# Patient Record
Sex: Female | Born: 1970 | Race: Black or African American | Hispanic: No | Marital: Married | Smoking: Never smoker
Health system: Southern US, Community
[De-identification: ages and names within clinical notes are randomized; demographics above are authoritative.]

## PROBLEM LIST (undated history)

## (undated) DIAGNOSIS — E039 Hypothyroidism, unspecified: Secondary | ICD-10-CM

## (undated) DIAGNOSIS — E079 Disorder of thyroid, unspecified: Secondary | ICD-10-CM

## (undated) DIAGNOSIS — E669 Obesity, unspecified: Secondary | ICD-10-CM

## (undated) HISTORY — PX: SHOULDER SURGERY: SHX246

## (undated) HISTORY — DX: Disorder of thyroid, unspecified: E07.9

## (undated) HISTORY — DX: Obesity, unspecified: E66.9

---

## 1998-01-02 HISTORY — PX: SHOULDER SURGERY: SHX246

## 2005-10-27 ENCOUNTER — Emergency Department (HOSPITAL_COMMUNITY): Admission: EM | Admit: 2005-10-27 | Discharge: 2005-10-28 | Payer: Self-pay | Admitting: Emergency Medicine

## 2005-10-27 ENCOUNTER — Emergency Department (HOSPITAL_COMMUNITY): Admission: EM | Admit: 2005-10-27 | Discharge: 2005-10-27 | Payer: Self-pay | Admitting: Emergency Medicine

## 2005-11-10 ENCOUNTER — Emergency Department: Payer: Self-pay | Admitting: Emergency Medicine

## 2006-02-14 ENCOUNTER — Emergency Department (HOSPITAL_COMMUNITY): Admission: EM | Admit: 2006-02-14 | Discharge: 2006-02-14 | Payer: Self-pay | Admitting: Emergency Medicine

## 2006-05-16 ENCOUNTER — Inpatient Hospital Stay (HOSPITAL_COMMUNITY): Admission: AD | Admit: 2006-05-16 | Discharge: 2006-05-17 | Payer: Self-pay | Admitting: Obstetrics & Gynecology

## 2006-05-31 ENCOUNTER — Ambulatory Visit (HOSPITAL_COMMUNITY): Admission: RE | Admit: 2006-05-31 | Discharge: 2006-05-31 | Payer: Self-pay | Admitting: Obstetrics and Gynecology

## 2006-07-17 ENCOUNTER — Emergency Department (HOSPITAL_COMMUNITY): Admission: EM | Admit: 2006-07-17 | Discharge: 2006-07-17 | Payer: Self-pay | Admitting: Emergency Medicine

## 2006-10-19 ENCOUNTER — Inpatient Hospital Stay (HOSPITAL_COMMUNITY): Admission: AD | Admit: 2006-10-19 | Discharge: 2006-10-19 | Payer: Self-pay | Admitting: Obstetrics and Gynecology

## 2006-11-29 ENCOUNTER — Inpatient Hospital Stay (HOSPITAL_COMMUNITY): Admission: AD | Admit: 2006-11-29 | Discharge: 2006-11-29 | Payer: Self-pay | Admitting: Obstetrics and Gynecology

## 2006-12-25 ENCOUNTER — Encounter (INDEPENDENT_AMBULATORY_CARE_PROVIDER_SITE_OTHER): Payer: Self-pay | Admitting: Obstetrics and Gynecology

## 2006-12-25 ENCOUNTER — Inpatient Hospital Stay (HOSPITAL_COMMUNITY): Admission: AD | Admit: 2006-12-25 | Discharge: 2006-12-27 | Payer: Self-pay | Admitting: Obstetrics and Gynecology

## 2007-10-15 ENCOUNTER — Encounter: Admission: RE | Admit: 2007-10-15 | Discharge: 2007-12-05 | Payer: Self-pay | Admitting: Internal Medicine

## 2008-02-06 ENCOUNTER — Inpatient Hospital Stay (HOSPITAL_COMMUNITY): Admission: AD | Admit: 2008-02-06 | Discharge: 2008-02-06 | Payer: Self-pay | Admitting: Obstetrics and Gynecology

## 2008-08-18 ENCOUNTER — Emergency Department (HOSPITAL_COMMUNITY): Admission: EM | Admit: 2008-08-18 | Discharge: 2008-08-18 | Payer: Self-pay | Admitting: Emergency Medicine

## 2010-01-24 ENCOUNTER — Encounter: Payer: Self-pay | Admitting: Obstetrics and Gynecology

## 2010-04-19 LAB — CBC
HCT: 35.9 % — ABNORMAL LOW (ref 36.0–46.0)
Hemoglobin: 12.1 g/dL (ref 12.0–15.0)
MCHC: 33.7 g/dL (ref 30.0–36.0)
MCV: 87.1 fL (ref 78.0–100.0)
RBC: 4.12 MIL/uL (ref 3.87–5.11)
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
## 2010-10-07 LAB — CBC
HCT: 34 — ABNORMAL LOW
MCHC: 34.8
MCV: 88.7
Platelets: 243
RBC: 4.36
RDW: 14.1

## 2010-10-07 LAB — RPR: RPR Ser Ql: NONREACTIVE

## 2010-10-12 LAB — URINALYSIS, ROUTINE W REFLEX MICROSCOPIC
Bilirubin Urine: NEGATIVE
Ketones, ur: NEGATIVE
Nitrite: POSITIVE — AB
Specific Gravity, Urine: 1.025
Urobilinogen, UA: 0.2

## 2011-03-02 ENCOUNTER — Emergency Department (HOSPITAL_COMMUNITY)
Admission: EM | Admit: 2011-03-02 | Discharge: 2011-03-02 | Disposition: A | Discharge status: Home / Self Care | Payer: Self-pay | Attending: Emergency Medicine | Admitting: Emergency Medicine

## 2011-03-02 ENCOUNTER — Emergency Department (HOSPITAL_COMMUNITY): Payer: Self-pay

## 2011-03-02 ENCOUNTER — Encounter (HOSPITAL_COMMUNITY): Payer: Self-pay | Admitting: *Deleted

## 2011-03-02 DIAGNOSIS — W010XXA Fall on same level from slipping, tripping and stumbling without subsequent striking against object, initial encounter: Secondary | ICD-10-CM | POA: Insufficient documentation

## 2011-03-02 DIAGNOSIS — M25569 Pain in unspecified knee: Secondary | ICD-10-CM | POA: Insufficient documentation

## 2011-03-02 DIAGNOSIS — Y9289 Other specified places as the place of occurrence of the external cause: Secondary | ICD-10-CM | POA: Insufficient documentation

## 2011-03-02 DIAGNOSIS — M25561 Pain in right knee: Secondary | ICD-10-CM

## 2011-03-02 DIAGNOSIS — Y990 Civilian activity done for income or pay: Secondary | ICD-10-CM | POA: Insufficient documentation

## 2011-03-02 NOTE — ED Notes (Signed)
Patient transported to X-ray 

## 2011-03-02 NOTE — ED Notes (Signed)
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

## 2011-03-02 NOTE — ED Provider Notes (Signed)
History     CSN: [id number]  Arrival date & time 03/02/11  1623   First MD Initiated Contact with Patient 03/02/11 1804      Chief Complaint  Patient presents with  . Knee Injury    (Consider location/radiation/quality/duration/timing/severity/associated sxs/prior treatment) HPI Comments: Patient reports that while at work she tripped and fell and landed on her right knee.    Patient is a 41 y.o. female presenting with knee pain. The history is provided by the patient.  Knee Pain This is a new problem. The current episode started today. The problem occurs constantly. The problem has been unchanged. Pertinent negatives include no chills, diaphoresis, fever, joint swelling, numbness or weakness. The symptoms are aggravated by bending and walking. She has tried nothing for the symptoms.    History reviewed. No pertinent past medical history.  Past Surgical History  Procedure Date  . Shoulder surgery 2000    left    No family history on file.  History  Substance Use Topics  . Smoking status: Never Smoker   . Smokeless tobacco: Not on file  . Alcohol Use: No    OB History    Grav Para Term Preterm Abortions TAB SAB Ect Mult Living                  Review of Systems  Constitutional: Negative for fever, chills and diaphoresis.  Cardiovascular: Negative for leg swelling.  Musculoskeletal: Negative for joint swelling.  Neurological: Negative for weakness and numbness.    Allergies  Cortisone  Home Medications   Current Outpatient Rx  Name Route Sig Dispense Refill  . IBUPROFEN 200 MG PO TABS Oral Take 600 mg by mouth every 8 (eight) hours as needed. For pain.      BP 122/76  Pulse 71  Temp(Src) 98.5 F (36.9 C) (Oral)  Resp 18  Wt 287 lb (130.182 kg)  SpO2 100%  LMP 01/29/2011  Physical Exam  Nursing note and vitals reviewed. Constitutional: She is oriented to person, place, and time. She appears well-developed and well-nourished. No distress.   Morbidly obese  Neck: Normal range of motion. Neck supple.  Cardiovascular: Normal rate, regular rhythm and normal heart sounds.   Pulmonary/Chest: Effort normal and breath sounds normal.  Musculoskeletal:       Right knee: She exhibits bony tenderness. She exhibits no swelling, no effusion, no ecchymosis, no deformity and no erythema. tenderness found.       Dorsal pedis pulse 2+ bilaterally. Distal sensation intact. Pain with ROM of right knee.  Neurological: She is alert and oriented to person, place, and time.  Skin: Skin is warm and dry. She is not diaphoretic. No erythema.  Psychiatric: She has a normal mood and affect.    ED Course  Procedures (including critical care time)  Labs Reviewed - No data to display Dg Knee Complete 4 Views Right  03/02/2011  *RADIOLOGY REPORT*  Clinical Data: Knee injury, right knee pain post fall  RIGHT KNEE - COMPLETE 4+ VIEW  Comparison: None  Findings: Joint spaces preserved. Osseous mineralization normal. No acute fracture, dislocation or bone destruction. No knee joint effusion.  IMPRESSION: No acute bony abnormalities.  Original Report Authenticated By: Lollie Marrow, M.D.     No diagnosis found.  Patient requesting knee immobilizer for stability.  MDM  Negative xrays.  Neurovascularly intact.  Patient given knee immobilizer and able to ambulate with this.  Patient given referral to ortho if pain does not improve.  Pascal Lux Lilly, PA-C 03/03/11 [phone]

## 2011-03-02 NOTE — ED Notes (Signed)
Pt returned from xray

## 2011-03-02 NOTE — Discharge Instructions (Signed)
Be sure to read and understand instructions below prior to leaving the hospital. If your symptoms persist without any improvement in 1 week it is recommended that you follow up with orthopedics listed above. Take ibuprofen for your pain. Knee Effusion  The medical term for having fluid in your knee is effusion.This means something is wrong inside the knee. Some of the causes of fluid in the knee may be torn cartilage, a torn ligament, or bleeding into the joint from an injury. Small tears may heal on their own with conservative treatment. Conservative means rest, limited weight bearing activity and muscle strengthening exercises. Your recovery may take up to 6 weeks. Larger tears may require surgery.   TREATMENT  Rest, ice, elevation, and compression are the basic modes of treatment.   Apply ice to the sore area for 15 to 20 minutes, 3 to 4 times per day. Do this while you are awake for the first 2 days, or as directed. This can be stopped when the swelling goes away. Put the ice in a plastic bag and place a towel between the bag of ice and your skin.  Keep your leg elevated when possible to lessen swelling.  If your caregiver recommends crutches, use them as instructed for 1 week. Then, you may walk as tolerated.  Do not drive a vehicle on pain medication. ACTIVITY:            - Weight bearing as tolerated            - Exercises should be limited to pain free range of motion  Knee Immobilization:: This is used to support and protect an injured or painful knee. Knee immobilizers keep your knee from being used while it is healing.  Use powder to control irritation from sweat and friction.  Adjust the immobilizer to be firm but not tight. Signs of an immobilizer that is too tight include:   Swelling.   Numbness.   Color change in your foot or ankle.   Increased pain.  While resting, raise your leg above the level of your heart. This reduces throbbing and helps healing. Prop it up with pillows.    Remove the immobilizer to bathe and sleep. Wear it other times until you see your doctor again.               SEEK MEDICAL CARE IF:  You have an increase in bruising, swelling, or pain.  Your toes feel cold.  Pain relief is not achieved with medications.  EMERGENCY:: Your toes are numb or blue or you have severe pain.  You notice redness, swelling, warmth or increasing pain in your knee.  An unexplained oral temperature above 102 F (38.9 C) develops.  COLD THERAPY DIRECTIONS:  Ice or gel packs can be used to reduce both pain and swelling. Ice is the most helpful within the first 24 to 48 hours after an injury or flareup from overusing a muscle or joint.  Ice is effective, has very few side effects, and is safe for most people to use.   If you expose your skin to cold temperatures for too long or without the proper protection, you can damage your skin or nerves. Watch for signs of skin damage due to cold.   HOME CARE INSTRUCTIONS  Follow these tips to use ice and cold packs safely.  Place a dry or damp towel between the ice and skin. A damp towel will cool the skin more quickly, so you may need to  shorten the time that the ice is used.  For a more rapid response, add gentle compression to the ice.  Ice for no more than 10 to 20 minutes at a time. The bonier the area you are icing, the less time it will take to get the benefits of ice.  Check your skin after 5 minutes to make sure there are no signs of a poor response to cold or skin damage.  Rest 20 minutes or more in between uses.  Once your skin is numb, you can end your treatment. You can test numbness by very lightly touching your skin. The touch should be so light that you do not see the skin dimple from the pressure of your fingertip. When using ice, most people will feel these normal sensations in this order: cold, burning, aching, and numbness.  Do not use ice on someone who cannot communicate their responses to pain, such as small  children or people with dementia.   HOW TO MAKE AN ICE PACK  To make an ice pack, do one of the following:  Place crushed ice or a bag of frozen vegetables in a sealable plastic bag. Squeeze out the excess air. Place this bag inside another plastic bag. Slide the bag into a pillowcase or place a damp towel between your skin and the bag.  Mix 3 parts water with 1 part rubbing alcohol. Freeze the mixture in a sealable plastic bag. When you remove the mixture from the freezer, it will be slushy. Squeeze out the excess air. Place this bag inside another plastic bag. Slide the bag into a pillowcase or place a damp towel between your s

## 2011-03-04 NOTE — ED Provider Notes (Signed)
Medical screening examination/treatment/procedure(s) were performed by non-physician practitioner and as supervising physician I was immediately available for consultation/collaboration.  Siennah Barrasso, MD 03/04/11 0124 

## 2013-05-01 ENCOUNTER — Encounter (HOSPITAL_COMMUNITY): Payer: Self-pay | Admitting: Emergency Medicine

## 2013-05-01 ENCOUNTER — Emergency Department (HOSPITAL_COMMUNITY)
Admission: EM | Admit: 2013-05-01 | Discharge: 2013-05-01 | Disposition: A | Discharge status: Home / Self Care | Payer: Medicaid Other | Source: Home / Self Care | Attending: Emergency Medicine | Admitting: Emergency Medicine

## 2013-05-01 ENCOUNTER — Emergency Department (INDEPENDENT_AMBULATORY_CARE_PROVIDER_SITE_OTHER): Payer: Medicaid Other

## 2013-05-01 DIAGNOSIS — M545 Low back pain, unspecified: Secondary | ICD-10-CM

## 2013-05-01 LAB — POCT URINALYSIS DIP (DEVICE)
Bilirubin Urine: NEGATIVE
GLUCOSE, UA: NEGATIVE mg/dL
Ketones, ur: NEGATIVE mg/dL
NITRITE: NEGATIVE
PROTEIN: NEGATIVE mg/dL
Specific Gravity, Urine: 1.02 (ref 1.005–1.030)
UROBILINOGEN UA: 0.2 mg/dL (ref 0.0–1.0)
pH: 6.5 (ref 5.0–8.0)

## 2013-05-01 LAB — POCT PREGNANCY, URINE: PREG TEST UR: NEGATIVE

## 2013-05-01 MED ORDER — OXYCODONE-ACETAMINOPHEN 5-325 MG PO TABS: ORAL_TABLET | ORAL | Status: DC

## 2013-05-01 MED ORDER — ACETAMINOPHEN 325 MG PO TABS
650.0000 mg | ORAL_TABLET | Freq: Once | ORAL | Status: AC
  Administered 2013-05-01: 650 mg via ORAL

## 2013-05-01 MED ORDER — DICLOFENAC SODIUM 75 MG PO TBEC: 75.0000 mg | DELAYED_RELEASE_TABLET | Freq: Two times a day (BID) | ORAL | Status: DC

## 2013-05-01 MED ORDER — KETOROLAC TROMETHAMINE 60 MG/2ML IM SOLN
INTRAMUSCULAR | Status: AC
  Filled 2013-05-01: qty 2

## 2013-05-01 MED ORDER — CYCLOBENZAPRINE HCL 5 MG PO TABS: 5.0000 mg | ORAL_TABLET | Freq: Three times a day (TID) | ORAL | Status: DC | PRN

## 2013-05-01 MED ORDER — KETOROLAC TROMETHAMINE 60 MG/2ML IM SOLN: 60.0000 mg | Freq: Once | INTRAMUSCULAR | Status: DC

## 2013-05-01 MED ORDER — ACETAMINOPHEN 325 MG PO TABS
ORAL_TABLET | ORAL | Status: AC
  Filled 2013-05-01: qty 2

## 2013-05-01 NOTE — ED Provider Notes (Signed)
Chief Complaint   Chief Complaint  Patient presents with  . Back Pain    History of Present Illness   Marcine Mataronja Nabozny is a 43 year old female who woke up yesterday morning with pain in her right mid lumbar area. This did not radiate down into the buttock, thigh, leg was worse with moving, driving, and twisting. She denies any numbness or tingling in the leg or the foot, muscle weakness in the leg, foot drop, bladder or bowel dysfunction, urinary symptoms, or saddle anesthesia. She has had no fever, chills, or weight loss. No injury to the back. She has not had back problems before.  Review of Systems   Other than as noted above, the patient denies any of the following symptoms: Systemic:  No fever, chills, or unexplained weight loss. GI:  No abdominal painor incontinence of bowel. GU:  No dysuria, frequency, urgency, or hematuria. No incontinence of urine or urinary retention.  M-S:  No neck pain or arthritis. Neuro:  No paresthesias, headache, saddle anesthesia, muscular weakness, or progressive neurological deficit.  PMFSH   Past medical history, family history, social history, meds, and allergies were reviewed. Specifically, there is no history of cancer, major trauma, osteoporosis, immunosuppression, or HIV infection.   Physical Examination    Vital signs:  BP 131/80  Pulse 85  Temp(Src) 98.4 F (36.9 C) (Oral)  Resp 16  SpO2 100%  LMP 04/07/2013 General:  Alert, oriented, in no distress. Abdomen:  Soft, non-tender.  No organomegaly or mass.  No pulsatile midline abdominal mass or bruit. Back:  There is pain to palpation in the left mid lumbar area. The back has a limited range of motion with 20 of flexion, 10 of lateral bending to the right, 45 of lateral bending to the left with pain on forward bending and bending to the right but not the left. Straight leg raising was negative. Neuro:  Normal muscle strength, sensations and DTRs. Extremities: Pedal pulses were full,  there was no edema. Skin:  Clear, warm and dry.  No rash.  Labs   Results for orders placed during the hospital encounter of 05/01/13  POCT URINALYSIS DIP (DEVICE)      Result Value Ref Range   Glucose, UA NEGATIVE  NEGATIVE mg/dL   Bilirubin Urine NEGATIVE  NEGATIVE   Ketones, ur NEGATIVE  NEGATIVE mg/dL   Specific Gravity, Urine 1.020  1.005 - 1.030   Hgb urine dipstick MODERATE (*) NEGATIVE   pH 6.5  5.0 - 8.0   Protein, ur NEGATIVE  NEGATIVE mg/dL   Urobilinogen, UA 0.2  0.0 - 1.0 mg/dL   Nitrite NEGATIVE  NEGATIVE   Leukocytes, UA TRACE (*) NEGATIVE  POCT PREGNANCY, URINE      Result Value Ref Range   Preg Test, Ur NEGATIVE  NEGATIVE      Urine was cultured.  Radiology   Dg Abd 1 View  05/01/2013   CLINICAL DATA:  Back pain, right-sided pain for 2 days  EXAM: ABDOMEN - 1 VIEW  COMPARISON:  None.  FINDINGS: The bowel gas pattern is normal. Moderate amount of stool in the ascending colon. No radio-opaque calculi or other significant radiographic abnormality are seen.  IMPRESSION: 1. Nonobstructive bowel gas pattern. 2. Moderate amount of stool in the ascending colon.   Electronically Signed   By: Elige KoHetal  Patel   On: 05/01/2013 10:53    [facility]   Given Tylenol 650 mg by mouth.    Assessment   The encounter  diagnosis was Lumbago.  No evidence of cauda equina syndrome.  Plan     1.  Meds:  The following meds were prescribed:   Discharge Medication List as of 05/01/2013 11:08 AM    START taking these medications   Details  cyclobenzaprine (FLEXERIL) 5 MG tablet Take 1 tablet (5 mg total) by mouth 3 (three) times daily as needed for muscle spasms., Starting 05/01/2013, Until Discontinued, Normal    diclofenac (VOLTAREN) 75 MG EC tablet Take 1 tablet (75 mg total) by mouth 2 (two) times daily., Starting 05/01/2013, Until Discontinued, Normal    oxyCODONE-acetaminophen (PERCOCET) 5-325 MG per tablet 1 to 2 tablets every 6 hours as needed for pain.,  Print        2.  Patient Education/Counseling:  The patient was given appropriate handouts, self care instructions, and instructed in symptomatic relief. The patient was encouraged to try to be as active as possible and given some exercises to do followed by moist heat.  3.  Follow up:  The patient was told to follow up here if no better in 3 to 4 days, or sooner if becoming worse in any way, and given some red flag symptoms such as worsening pain or new neurological symptoms which would prompt immediate return.  Follow up with Dr. Beverely LowSteve Norris if no better in 2 weeks.     Reuben Likesavid C Emersyn Wyss, MD 05/01/13 1230

## 2013-05-01 NOTE — Discharge Instructions (Signed)
Do exercises twice daily followed by moist heat for 15 minutes. ° ° ° ° ° °Try to be as active as possible. ° °If no better in 2 weeks, follow up with orthopedist. ° ° °

## 2013-05-01 NOTE — ED Notes (Signed)
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

## 2013-05-02 LAB — URINE CULTURE: Colony Count: >=100000

## 2013-08-03 ENCOUNTER — Encounter (HOSPITAL_COMMUNITY): Payer: Self-pay | Admitting: Emergency Medicine

## 2013-08-03 ENCOUNTER — Emergency Department (INDEPENDENT_AMBULATORY_CARE_PROVIDER_SITE_OTHER): Payer: Medicaid Other

## 2013-08-03 ENCOUNTER — Emergency Department (HOSPITAL_COMMUNITY)
Admission: EM | Admit: 2013-08-03 | Discharge: 2013-08-03 | Disposition: A | Discharge status: Home / Self Care | Payer: Medicaid Other | Source: Home / Self Care | Attending: Family Medicine | Admitting: Family Medicine

## 2013-08-03 DIAGNOSIS — M7651 Patellar tendinitis, right knee: Secondary | ICD-10-CM

## 2013-08-03 DIAGNOSIS — M7650 Patellar tendinitis, unspecified knee: Secondary | ICD-10-CM

## 2013-08-03 MED ORDER — MELOXICAM 7.5 MG PO TABS: 7.5000 mg | ORAL_TABLET | Freq: Two times a day (BID) | ORAL | Status: DC

## 2013-08-03 NOTE — ED Notes (Signed)
Patient c/o knee pain x 1 week. 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 Also feels pain when she stands and puts pressure on her leg. Patient is alert and oriented and in no acute distress.

## 2013-08-03 NOTE — Discharge Instructions (Signed)
Ice and wrap and medicine as needed, see orthopedist if further problems.

## 2013-08-03 NOTE — ED Provider Notes (Signed)
CSN: [id number]     Arrival date & time 08/03/13  1309 History   First MD Initiated Contact with Patient 08/03/13 1315     Chief Complaint  Patient presents with  . Knee Pain   (Consider location/radiation/quality/duration/timing/severity/associated sxs/prior Treatment) Patient is a 43 y.o. female presenting with knee pain. The history is provided by the patient.  Knee Pain Location:  Knee Injury: no   Knee location:  R knee Pain details:    Quality:  Sharp   Radiates to:  Does not radiate   Severity:  Moderate   Onset quality:  Gradual   Duration:  1 week   Progression:  Worsening Chronicity:  New Dislocation: no   Prior injury to area:  No Worsened by:  Bearing weight (worse with stairs.) Associated symptoms: decreased ROM   Associated symptoms: no back pain and no fever   Risk factors: obesity     History reviewed. No pertinent past medical history. Past Surgical History  Procedure Laterality Date  . Shoulder surgery  2000    left   No family history on file. History  Substance Use Topics  . Smoking status: Never Smoker   . Smokeless tobacco: Not on file  . Alcohol Use: No   OB History   Grav Para Term Preterm Abortions TAB SAB Ect Mult Living                 Review of Systems  Constitutional: Negative.  Negative for fever.  Genitourinary: Negative for pelvic pain.  Musculoskeletal: Positive for gait problem. Negative for back pain and joint swelling.  Skin: Negative.     Allergies  Cortisone  Home Medications   Prior to Admission medications   Medication Sig Start Date End Date Taking? Authorizing Provider  cyclobenzaprine (FLEXERIL) 5 MG tablet Take 1 tablet (5 mg total) by mouth 3 (three) times daily as needed for muscle spasms. 05/01/13   Reuben Likesavid C Keller, MD  diclofenac (VOLTAREN) 75 MG EC tablet Take 1 tablet (75 mg total) by mouth 2 (two) times daily. 05/01/13   Reuben Likesavid C Keller, MD  ibuprofen (ADVIL,MOTRIN) 200 MG tablet Take 600 mg by mouth every 8  (eight) hours as needed. For pain.    Historical Provider, MD  meloxicam (MOBIC) 7.5 MG tablet Take 1 tablet (7.5 mg total) by mouth 2 (two) times daily after a meal. 08/03/13   Linna HoffJames D Braelen Sproule, MD  oxyCODONE-acetaminophen (PERCOCET) 5-325 MG per tablet 1 to 2 tablets every 6 hours as needed for pain. 05/01/13   Reuben Likesavid C Keller, MD   BP 126/72  Pulse 75  Temp(Src) 98.6 F (37 C) (Oral)  Resp 18  SpO2 100%  LMP 07/16/2013 Physical Exam  Nursing note and vitals reviewed. Constitutional: She is oriented to person, place, and time. She appears well-developed and well-nourished.  Musculoskeletal: She exhibits tenderness.       Right knee: She exhibits abnormal alignment and abnormal patellar mobility. She exhibits normal range of motion and no swelling. Tenderness found. Patellar tendon tenderness noted. No medial joint line, no lateral joint line, no MCL and no LCL tenderness noted.       Legs: Neurological: She is alert and oriented to person, place, and time.  Skin: Skin is warm and dry.    ED Course  Procedures (including critical care time) Labs Review Labs Reviewed - No data to display  Imaging Review Dg Knee Complete 4 Views Right  08/03/2013   CLINICAL DATA:  Right knee pain  EXAM: RIGHT KNEE - COMPLETE 4+ VIEW  COMPARISON:  None.  FINDINGS: No fracture or dislocation is seen.  The joint spaces are preserved.  No definite suprapatellar knee joint effusion.  IMPRESSION: No fracture or dislocation is seen.   Electronically Signed   By: Charline Bills M.D.   On: 08/03/2013 13:44     MDM   1. Patellar tendonitis of right knee        Linna Hoff, MD 08/03/13 1401

## 2014-01-08 ENCOUNTER — Ambulatory Visit: Payer: Medicaid Other | Attending: Specialist | Admitting: Physical Therapy

## 2014-01-08 DIAGNOSIS — M949 Disorder of cartilage, unspecified: Secondary | ICD-10-CM | POA: Insufficient documentation

## 2014-01-08 DIAGNOSIS — M25561 Pain in right knee: Secondary | ICD-10-CM | POA: Diagnosis present

## 2015-12-31 ENCOUNTER — Emergency Department (HOSPITAL_COMMUNITY): Payer: No Typology Code available for payment source

## 2015-12-31 ENCOUNTER — Encounter (HOSPITAL_COMMUNITY): Payer: Self-pay | Admitting: *Deleted

## 2015-12-31 ENCOUNTER — Emergency Department (HOSPITAL_COMMUNITY)
Admission: EM | Admit: 2015-12-31 | Discharge: 2015-12-31 | Disposition: A | Discharge status: Home / Self Care | Payer: No Typology Code available for payment source | Attending: Emergency Medicine | Admitting: Emergency Medicine

## 2015-12-31 DIAGNOSIS — Y92410 Unspecified street and highway as the place of occurrence of the external cause: Secondary | ICD-10-CM | POA: Insufficient documentation

## 2015-12-31 DIAGNOSIS — Z79899 Other long term (current) drug therapy: Secondary | ICD-10-CM | POA: Insufficient documentation

## 2015-12-31 DIAGNOSIS — S299XXA Unspecified injury of thorax, initial encounter: Secondary | ICD-10-CM | POA: Insufficient documentation

## 2015-12-31 DIAGNOSIS — Y939 Activity, unspecified: Secondary | ICD-10-CM | POA: Diagnosis not present

## 2015-12-31 DIAGNOSIS — Y999 Unspecified external cause status: Secondary | ICD-10-CM | POA: Insufficient documentation

## 2015-12-31 DIAGNOSIS — R0789 Other chest pain: Secondary | ICD-10-CM

## 2015-12-31 MED ORDER — IBUPROFEN 600 MG PO TABS: 600.0000 mg | ORAL_TABLET | Freq: Four times a day (QID) | ORAL | 0 refills | Status: DC | PRN

## 2015-12-31 MED ORDER — IBUPROFEN 200 MG PO TABS
600.0000 mg | ORAL_TABLET | Freq: Once | ORAL | Status: AC
  Administered 2015-12-31: 600 mg via ORAL
  Filled 2015-12-31: qty 1

## 2015-12-31 NOTE — ED Notes (Signed)
Respiratory brought Incentive spirometer
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
## 2015-12-31 NOTE — ED Notes (Signed)
Pt stable, ambulatory, states understanding of discharge instructions 

## 2015-12-31 NOTE — ED Notes (Addendum)
See Provider note for assessment.

## 2015-12-31 NOTE — Discharge Instructions (Signed)
Take tylenol in addition to the medication we give you. Return as needed for worsening symptoms.
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
## 2019-05-30 DIAGNOSIS — Z0001 Encounter for general adult medical examination with abnormal findings: Secondary | ICD-10-CM | POA: Diagnosis not present

## 2019-06-12 DIAGNOSIS — R946 Abnormal results of thyroid function studies: Secondary | ICD-10-CM | POA: Diagnosis not present

## 2019-06-12 DIAGNOSIS — Z13220 Encounter for screening for lipoid disorders: Secondary | ICD-10-CM | POA: Diagnosis not present

## 2019-06-12 DIAGNOSIS — Z131 Encounter for screening for diabetes mellitus: Secondary | ICD-10-CM | POA: Diagnosis not present

## 2019-06-13 DIAGNOSIS — R7989 Other specified abnormal findings of blood chemistry: Secondary | ICD-10-CM | POA: Diagnosis not present

## 2019-07-03 DIAGNOSIS — R946 Abnormal results of thyroid function studies: Secondary | ICD-10-CM | POA: Diagnosis not present

## 2019-07-03 DIAGNOSIS — R7303 Prediabetes: Secondary | ICD-10-CM | POA: Diagnosis not present

## 2019-07-03 DIAGNOSIS — Z6841 Body Mass Index (BMI) 40.0 and over, adult: Secondary | ICD-10-CM | POA: Diagnosis not present

## 2019-07-11 ENCOUNTER — Other Ambulatory Visit: Payer: Self-pay | Admitting: Internal Medicine

## 2019-07-11 DIAGNOSIS — E010 Iodine-deficiency related diffuse (endemic) goiter: Secondary | ICD-10-CM

## 2019-07-11 DIAGNOSIS — E0590 Thyrotoxicosis, unspecified without thyrotoxic crisis or storm: Secondary | ICD-10-CM

## 2019-08-07 DIAGNOSIS — R946 Abnormal results of thyroid function studies: Secondary | ICD-10-CM | POA: Diagnosis not present

## 2020-01-30 DIAGNOSIS — Z111 Encounter for screening for respiratory tuberculosis: Secondary | ICD-10-CM | POA: Diagnosis not present

## 2020-09-05 ENCOUNTER — Emergency Department (HOSPITAL_COMMUNITY)
Admission: EM | Admit: 2020-09-05 | Discharge: 2020-09-05 | Disposition: A | Discharge status: Home / Self Care | Payer: BC Managed Care – PPO | Attending: Emergency Medicine | Admitting: Emergency Medicine

## 2020-09-05 ENCOUNTER — Emergency Department (HOSPITAL_COMMUNITY): Payer: BC Managed Care – PPO

## 2020-09-05 ENCOUNTER — Other Ambulatory Visit: Payer: Self-pay

## 2020-09-05 DIAGNOSIS — M79622 Pain in left upper arm: Secondary | ICD-10-CM | POA: Diagnosis not present

## 2020-09-05 DIAGNOSIS — S0003XA Contusion of scalp, initial encounter: Secondary | ICD-10-CM | POA: Insufficient documentation

## 2020-09-05 DIAGNOSIS — M25512 Pain in left shoulder: Secondary | ICD-10-CM | POA: Insufficient documentation

## 2020-09-05 DIAGNOSIS — R000 Tachycardia, unspecified: Secondary | ICD-10-CM | POA: Insufficient documentation

## 2020-09-05 DIAGNOSIS — M25561 Pain in right knee: Secondary | ICD-10-CM | POA: Insufficient documentation

## 2020-09-05 DIAGNOSIS — Y92410 Unspecified street and highway as the place of occurrence of the external cause: Secondary | ICD-10-CM | POA: Insufficient documentation

## 2020-09-05 DIAGNOSIS — S0990XA Unspecified injury of head, initial encounter: Secondary | ICD-10-CM | POA: Diagnosis present

## 2020-09-05 DIAGNOSIS — M79652 Pain in left thigh: Secondary | ICD-10-CM | POA: Diagnosis not present

## 2020-09-05 MED ORDER — HYDROCODONE-ACETAMINOPHEN 5-325 MG PO TABS
1.0000 | ORAL_TABLET | Freq: Once | ORAL | Status: AC
  Administered 2020-09-05: 1 via ORAL
  Filled 2020-09-05: qty 1
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
## 2020-09-05 NOTE — ED Notes (Signed)
Family at bedside. 
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
## 2020-09-05 NOTE — ED Notes (Signed)
Patient transported to X-ray 

## 2021-07-18 ENCOUNTER — Emergency Department (HOSPITAL_COMMUNITY): Payer: BC Managed Care – PPO

## 2021-07-18 ENCOUNTER — Encounter: Payer: Self-pay | Admitting: Physician Assistant

## 2021-07-18 ENCOUNTER — Encounter (HOSPITAL_COMMUNITY): Payer: Self-pay

## 2021-07-18 ENCOUNTER — Emergency Department (HOSPITAL_COMMUNITY)
Admission: EM | Admit: 2021-07-18 | Discharge: 2021-07-18 | Disposition: A | Discharge status: Home / Self Care | Payer: BC Managed Care – PPO | Attending: Emergency Medicine | Admitting: Emergency Medicine

## 2021-07-18 ENCOUNTER — Other Ambulatory Visit: Payer: Self-pay

## 2021-07-18 ENCOUNTER — Other Ambulatory Visit (HOSPITAL_COMMUNITY): Payer: Self-pay

## 2021-07-18 DIAGNOSIS — R1084 Generalized abdominal pain: Secondary | ICD-10-CM | POA: Insufficient documentation

## 2021-07-18 DIAGNOSIS — R109 Unspecified abdominal pain: Secondary | ICD-10-CM | POA: Diagnosis present

## 2021-07-18 DIAGNOSIS — N9489 Other specified conditions associated with female genital organs and menstrual cycle: Secondary | ICD-10-CM | POA: Insufficient documentation

## 2021-07-18 DIAGNOSIS — K8020 Calculus of gallbladder without cholecystitis without obstruction: Secondary | ICD-10-CM

## 2021-07-18 LAB — URINALYSIS, ROUTINE W REFLEX MICROSCOPIC
Bacteria, UA: NONE SEEN
Bilirubin Urine: NEGATIVE
Glucose, UA: NEGATIVE mg/dL
Ketones, ur: 5 mg/dL — AB
Nitrite: NEGATIVE
Protein, ur: NEGATIVE mg/dL
Specific Gravity, Urine: >1.046 — ABNORMAL HIGH (ref 1.005–1.030)
pH: 7 (ref 5.0–8.0)

## 2021-07-18 LAB — COMPREHENSIVE METABOLIC PANEL
ALT: 13 U/L (ref 0–44)
AST: 16 U/L (ref 15–41)
Albumin: 3.5 g/dL (ref 3.5–5.0)
Alkaline Phosphatase: 51 U/L (ref 38–126)
Anion gap: 7 (ref 5–15)
BUN: 16 mg/dL (ref 6–20)
CO2: 24 mmol/L (ref 22–32)
Calcium: 8.9 mg/dL (ref 8.9–10.3)
Chloride: 111 mmol/L (ref 98–111)
Creatinine, Ser: 0.64 mg/dL (ref 0.44–1.00)
GFR, Estimated: >60 mL/min (ref >60)
Glucose, Bld: 105 mg/dL — ABNORMAL HIGH (ref 70–99)
Potassium: 3.6 mmol/L (ref 3.5–5.1)
Sodium: 142 mmol/L (ref 135–145)
Total Bilirubin: 0.4 mg/dL (ref 0.3–1.2)
Total Protein: 7 g/dL (ref 6.5–8.1)

## 2021-07-18 LAB — I-STAT BETA HCG BLOOD, ED (MC, WL, AP ONLY): I-stat hCG, quantitative: <5 m[IU]/mL (ref <5)

## 2021-07-18 LAB — CBC WITH DIFFERENTIAL/PLATELET
Abs Immature Granulocytes: 0.05 10*3/uL (ref 0.00–0.07)
Basophils Absolute: 0.1 10*3/uL (ref 0.0–0.1)
Basophils Relative: 1 %
Eosinophils Absolute: 0.3 10*3/uL (ref 0.0–0.5)
Eosinophils Relative: 3 %
HCT: 39.4 % (ref 36.0–46.0)
Hemoglobin: 12.6 g/dL (ref 12.0–15.0)
Immature Granulocytes: 1 %
Lymphocytes Relative: 18 %
Lymphs Abs: 1.5 10*3/uL (ref 0.7–4.0)
MCH: 27.6 pg (ref 26.0–34.0)
MCHC: 32 g/dL (ref 30.0–36.0)
MCV: 86.4 fL (ref 80.0–100.0)
Monocytes Absolute: 0.7 10*3/uL (ref 0.1–1.0)
Monocytes Relative: 8 %
Neutro Abs: 5.7 10*3/uL (ref 1.7–7.7)
Neutrophils Relative %: 69 %
Platelets: 314 10*3/uL (ref 150–400)
RBC: 4.56 MIL/uL (ref 3.87–5.11)
RDW: 13.6 % (ref 11.5–15.5)
WBC: 8.2 10*3/uL (ref 4.0–10.5)
nRBC: 0 % (ref 0.0–0.2)

## 2021-07-18 LAB — LIPASE, BLOOD: Lipase: 22 U/L (ref 11–51)

## 2021-07-18 MED ORDER — IOHEXOL 350 MG/ML SOLN
100.0000 mL | Freq: Once | INTRAVENOUS | Status: AC | PRN
  Administered 2021-07-18: 100 mL via INTRAVENOUS

## 2021-07-18 MED ORDER — ONDANSETRON HCL 4 MG/2ML IJ SOLN
4.0000 mg | Freq: Once | INTRAMUSCULAR | Status: AC
  Administered 2021-07-18: 4 mg via INTRAVENOUS
  Filled 2021-07-18: qty 2

## 2021-07-18 MED ORDER — ONDANSETRON 4 MG PO TBDP
4.0000 mg | ORAL_TABLET | Freq: Three times a day (TID) | ORAL | 0 refills | Status: DC | PRN
  Filled 2021-07-18: qty 20, 7d supply, fill #0

## 2021-07-18 MED ORDER — HYDROMORPHONE HCL 1 MG/ML IJ SOLN
1.0000 mg | Freq: Once | INTRAMUSCULAR | Status: AC
  Administered 2021-07-18: 1 mg via INTRAVENOUS
  Filled 2021-07-18: qty 1

## 2021-07-18 NOTE — ED Triage Notes (Signed)
Pt BIB [facility] from home. Pt c/o flank pain that started 1 day ago and progressively got worse up to an hour before arrival. Pt denies kidney stone history. Denies dysuria, urinary retention

## 2021-07-18 NOTE — ED Notes (Signed)
US at bedside

## 2021-07-18 NOTE — ED Provider Notes (Signed)
[facility]-EMERGENCY DEPT Provider Note   CSN: [id number] Arrival date & time: 07/18/21  0446     History  Chief Complaint  Patient presents with   Flank Pain    Amber Koch is a 51 y.o. female.  51 year old female presents to the emergency department for evaluation of back and abdominal pain.  Pain began 1 day ago.  She describes pain across her low back, though it also is present under her right breast.  Pain intensified 1 hour prior to arrival and was unrelieved with a muscle relaxer that patient had leftover from a prior prescription.  She has had associated nausea.  No fevers, hematuria, dysuria, vomiting, melena or hematochezia, bowel/bladder incontinence, genital numbness.  She had a normal bowel movement yesterday morning.  No prior abdominal surgeries.  The history is provided by the patient. No language interpreter was used.  Flank Pain       Home Medications Prior to Admission medications   Medication Sig Start Date End Date Taking? Authorizing Provider  ibuprofen (ADVIL,MOTRIN) 600 MG tablet Take 1 tablet (600 mg total) by mouth every 6 (six) hours as needed. 12/31/15   Janne Napoleon, NP  oxyCODONE-acetaminophen (PERCOCET) 5-325 MG per tablet 1 to 2 tablets every 6 hours as needed for pain. 05/01/13   Reuben Likes, MD      Allergies    Cortisone    Review of Systems   Review of Systems  Genitourinary:  Positive for flank pain.  Ten systems reviewed and are negative for acute change, except as noted in the HPI.    Physical Exam Updated Vital Signs BP 95/70   Pulse 64   Temp 98.1 F (36.7 C) (Oral)   Resp 12   Ht 5' (1.524 m)   Wt 131.5 kg   SpO2 98%   BMI 56.64 kg/m   Physical Exam Vitals and nursing note reviewed.  Constitutional:      General: She is not in acute distress.    Appearance: She is well-developed. She is not diaphoretic.     Comments: Morbidly obese, pleasant AA female.  HENT:     Head: Normocephalic and  atraumatic.  Eyes:     General: No scleral icterus.    Conjunctiva/sclera: Conjunctivae normal.  Cardiovascular:     Rate and Rhythm: Normal rate and regular rhythm.     Pulses: Normal pulses.  Pulmonary:     Effort: Pulmonary effort is normal. No respiratory distress.     Comments: Respirations even and unlabored Abdominal:     Palpations: Abdomen is soft.     Tenderness: There is abdominal tenderness.     Comments: Abdomen obese limiting exam.  Diffuse tenderness noted, though this is appreciated to be worse in the right upper quadrant and right lower quadrant.  No peritoneal signs.  Musculoskeletal:        General: Normal range of motion.     Cervical back: Normal range of motion.  Skin:    General: Skin is warm and dry.     Coloration: Skin is not pale.     Findings: No erythema or rash.  Neurological:     Mental Status: She is alert and oriented to person, place, and time.     Coordination: Coordination normal.  Psychiatric:        Behavior: Behavior normal.     ED Results / Procedures / Treatments   Labs (all labs ordered are listed, but only abnormal results are displayed) Labs  Reviewed  COMPREHENSIVE METABOLIC PANEL - Abnormal; Notable for the following components:      Result Value   Glucose, Bld 105 (*)    All other components within normal limits  CBC WITH DIFFERENTIAL/PLATELET  LIPASE, BLOOD  URINALYSIS, ROUTINE W REFLEX MICROSCOPIC  I-STAT BETA HCG BLOOD, ED ([facility], [facility], [facility] ONLY)    EKG None  Radiology CT ABDOMEN PELVIS W CONTRAST  Result Date: 07/18/2021 CLINICAL DATA:  51 year old female with flank pain x1 day. EXAM: CT ABDOMEN AND PELVIS WITH CONTRAST TECHNIQUE: Multidetector CT imaging of the abdomen and pelvis was performed using the standard protocol following bolus administration of intravenous contrast. RADIATION DOSE REDUCTION: This exam was performed according to the departmental dose-optimization program which includes automated exposure control,  adjustment of the mA and/or kV according to patient size and/or use of iterative reconstruction technique. CONTRAST:  OMNIPAQUE IOHEXOL 350 MG/ML SOLN COMPARISON:  None Available. FINDINGS: Lower chest: Mild lung base atelectasis, otherwise negative. Hepatobiliary: Layering gravel type gallstones. No gallbladder inflammation or biliary ductal enlargement. Liver enhancement remains normal. Pancreas: Negative. Spleen: Negative. Adrenals/Urinary Tract: Normal adrenal glands. Nonobstructed kidneys. No nephrolithiasis or pararenal inflammation. Small left renal upper pole benign-appearing cyst (no follow-up imaging recommended). Both ureters are decompressed. Negative bladder. Stomach/Bowel: Mild retained stool throughout the colon. Normal appendix tracking medial from the cecum series 2, image 56. No large bowel inflammation. Decompressed terminal ileum. No dilated small bowel. Fat containing umbilical hernia with no active inflammation (sagittal image 96). Mostly decompressed stomach. Negative duodenum. No free air, free fluid, mesenteric inflammation. Vascular/Lymphatic: Major arterial structures and the portal venous system in the abdomen and pelvis appear patent. Minimal iliac calcified atherosclerosis. No lymphadenopathy identified. Reproductive: Negative. Other: No pelvic free fluid. Musculoskeletal: Lower lumbar facet arthropathy. Vacuum facet. No acute osseous abnormality identified. IMPRESSION: 1. Cholelithiasis with no CT evidence of acute cholecystitis or bile duct obstruction. But if there is right side pain then Right Upper Quadrant Ultrasound would be complementary. 2. No acute or inflammatory process identified in the abdomen or pelvis. No urinary calculi. Normal appendix. Fat containing umbilical hernia. Electronically Signed   By: Odessa Fleming M.D.   On: 07/18/2021 06:41    Procedures Procedures    Medications Ordered in ED Medications  HYDROmorphone (DILAUDID) injection 1 mg (1 mg Intravenous  Given 07/18/21 0551)  ondansetron (ZOFRAN) injection 4 mg (4 mg Intravenous Given 07/18/21 0548)  iohexol (OMNIPAQUE) 350 MG/ML injection 100 mL (100 mLs Intravenous Contrast Given 07/18/21 5093)    ED Course/ Medical Decision Making/ A&P                           Medical Decision Making Amount and/or Complexity of Data Reviewed Labs: ordered. Radiology: ordered.  Risk Prescription drug management.   This patient presents to the ED for concern of back and flank pain, this involves an extensive number of treatment options, and is a complaint that carries with it a high risk of complications and morbidity.  The differential diagnosis includes pyelonephritis vs kidney stone vs biliary colic vs. MSK sprain/strain   Co morbidities that complicate the patient evaluation  Morbid obesity   Lab Tests:  I Ordered, and personally interpreted labs.  The pertinent results include:  normal CBC, CMP, lipase   Imaging Studies ordered:  I ordered imaging studies including CT abdomen/pelvis  I independently visualized and interpreted imaging which showed gallstones without pericholecystic fluid. I agree with the radiologist interpretation  Cardiac Monitoring:  The patient was maintained on a cardiac monitor.  I personally viewed and interpreted the cardiac monitored which showed an underlying rhythm of: NSR   Medicines ordered and prescription drug management:  I ordered medication including Dilaudid for pain and Zofran for nausea   Reevaluation of the patient after these medicines showed that the patient improved I have reviewed the patients home medicines and have made adjustments as needed   Social Determinants of Health:  Insured.  Good social support; family at bedside.   Dispostion:  Disposition per CT imaging and UA results. Care signed out to Smoot, PA-C at shift change         Final Clinical Impression(s) / ED Diagnoses Final diagnoses:  Abdominal pain,  unspecified abdominal location    Rx / DC Orders ED Discharge Orders     None         Antony Madura, PA-C 07/18/21 6629    Pollyann Savoy, MD 07/18/21 [phone]

## 2021-07-18 NOTE — ED Provider Notes (Signed)
Care handoff from [facility], PA-C at shift change. Please see their note for further information.  Briefly: Patient presents today with complaints of back and abdominal pain that began 1 day ago. Pain present across her low back as well as under the right breast. Nausea without vomiting or diarrhea.  Plan: Labs overall unremarkable, UA pending as well as CT. Will reassess following these results.  CT scan results reveal   1. Cholelithiasis with no CT evidence of acute cholecystitis or bile duct obstruction. But if there is right side pain then Right Upper Quadrant Ultrasound would be complementary.   2. No acute or inflammatory process identified in the abdomen or pelvis. No urinary calculi. Normal appendix. Fat containing umbilical hernia.  RUQ ordered for further evaluation which reveals  Cholelithiasis without gallbladder wall thickening or pericholecystic fluid. No biliary dilatation.  I have personally reviewed and interpreted this imaging and agree with radiology interpretation.  UA reveals small hgb, 5 ketones, and moderate leukocytes without WBCs or bacteria. No concern for UTI  Upon reassessment, patient states that her pain has improved, however requests second round of pain medication for management of symptoms.   Patient is nontoxic, nonseptic appearing, in no apparent distress.  Patient's pain and other symptoms adequately managed in emergency department.  Fluid bolus given.  Labs, imaging and vitals reviewed.  Patient does not meet the SIRS or Sepsis criteria.  On repeat exam patient does not have a surgical abdomin and there are no peritoneal signs.  No indication of appendicitis, bowel obstruction, bowel perforation, cholecystitis, diverticulitis, PID or ectopic pregnancy.  Patient discharged home with Zofran for symptomatic treatment and given strict instructions for follow-up with their primary care physician. Will also give GI follow-up given CT and US findings. I have  also discussed reasons to return immediately to the ER.  Patient expresses understanding and agrees with plan. Discharged in stable condition.     Vear Clock 07/18/21 1226    Linwood Dibbles, MD 07/18/21 [phone]

## 2021-07-18 NOTE — Discharge Instructions (Addendum)
As we discussed, your work-up in the ER today was reassuring for acute abnormalities.  CT, ultrasound, and laboratory evaluation did not show any emergent concerns.  There was some findings of gallstones within your gallbladder, however no emergent concerns related to this.  I have given you a referral to gastroenterology for continued evaluation and management of this.  You will need to call and schedule an appointment at your earliest convenience.  I have also given you a prescription for Zofran to help with any residual nausea that you might have.  Return if development of any new or worsening symptoms.

## 2021-07-29 ENCOUNTER — Other Ambulatory Visit (INDEPENDENT_AMBULATORY_CARE_PROVIDER_SITE_OTHER): Payer: BC Managed Care – PPO

## 2021-07-29 ENCOUNTER — Ambulatory Visit (INDEPENDENT_AMBULATORY_CARE_PROVIDER_SITE_OTHER): Payer: BC Managed Care – PPO | Admitting: Physician Assistant

## 2021-07-29 ENCOUNTER — Encounter: Payer: Self-pay | Admitting: Physician Assistant

## 2021-07-29 VITALS — BP 124/92 | HR 87 | Ht 60.0 in | Wt 287.0 lb

## 2021-07-29 DIAGNOSIS — R1011 Right upper quadrant pain: Secondary | ICD-10-CM | POA: Diagnosis not present

## 2021-07-29 DIAGNOSIS — M5489 Other dorsalgia: Secondary | ICD-10-CM | POA: Diagnosis not present

## 2021-07-29 DIAGNOSIS — K8020 Calculus of gallbladder without cholecystitis without obstruction: Secondary | ICD-10-CM

## 2021-07-29 DIAGNOSIS — K8050 Calculus of bile duct without cholangitis or cholecystitis without obstruction: Secondary | ICD-10-CM | POA: Diagnosis not present

## 2021-07-29 LAB — COMPREHENSIVE METABOLIC PANEL
ALT: 14 U/L (ref 0–35)
AST: 14 U/L (ref 0–37)
Albumin: 4.1 g/dL (ref 3.5–5.2)
Alkaline Phosphatase: 56 U/L (ref 39–117)
BUN: 19 mg/dL (ref 6–23)
CO2: 27 mEq/L (ref 19–32)
Calcium: 8.8 mg/dL (ref 8.4–10.5)
Chloride: 107 mEq/L (ref 96–112)
Creatinine, Ser: 0.58 mg/dL (ref 0.40–1.20)
GFR: 106.27 mL/min (ref >60.00)
Glucose, Bld: 90 mg/dL (ref 70–99)
Potassium: 3.8 mEq/L (ref 3.5–5.1)
Sodium: 140 mEq/L (ref 135–145)
Total Bilirubin: 0.5 mg/dL (ref 0.2–1.2)
Total Protein: 6.9 g/dL (ref 6.0–8.3)

## 2021-07-29 LAB — CBC WITH DIFFERENTIAL/PLATELET
Basophils Absolute: 0.1 10*3/uL (ref 0.0–0.1)
Basophils Relative: 1.2 % (ref 0.0–3.0)
Eosinophils Absolute: 0.2 10*3/uL (ref 0.0–0.7)
Eosinophils Relative: 3.3 % (ref 0.0–5.0)
HCT: 37.7 % (ref 36.0–46.0)
Hemoglobin: 12.6 g/dL (ref 12.0–15.0)
Lymphocytes Relative: 23 % (ref 12.0–46.0)
Lymphs Abs: 1.7 10*3/uL (ref 0.7–4.0)
MCHC: 33.4 g/dL (ref 30.0–36.0)
MCV: 83.6 fl (ref 78.0–100.0)
Monocytes Absolute: 0.6 10*3/uL (ref 0.1–1.0)
Monocytes Relative: 7.8 % (ref 3.0–12.0)
Neutro Abs: 4.8 10*3/uL (ref 1.4–7.7)
Neutrophils Relative %: 64.7 % (ref 43.0–77.0)
Platelets: 274 10*3/uL (ref 150.0–400.0)
RBC: 4.51 Mil/uL (ref 3.87–5.11)
RDW: 14.6 % (ref 11.5–15.5)
WBC: 7.4 10*3/uL (ref 4.0–10.5)

## 2021-07-29 NOTE — Progress Notes (Signed)
Subjective:    Patient ID: Amber Koch, female    DOB: [birth year], 51 y.o.   MRN: [id number]  HPI  Amber Koch is a pleasant 51 year old African-American female, new to GI today referred by the [facility] emergency room after recent ER visit on 07/18/2021 when she was evaluated for acute abdominal pain. She has not had any prior GI evaluation. She says that she was having some milder episodes of pain for a couple of days prior to the ER visit then on the day of of the ER evaluation she was awakened in the middle of the night with severe sided back pain that seem to radiate around into the right upper abdomen she describes this as being sharp deep pain that was constant for multiple hours until she had pain medication in the emergency room.  No associated nausea or vomiting no diarrhea no melena or hematochezia, no fever or chills. After she was discharged in the emergency room she has continued to have the same discomfort which has not been as severe but has not completely resolved.  She still feeling the pain more in the right back and some in the right upper quadrant intermittently.  She does feel that it gets aggravated postprandially, she still has not had any nausea or vomiting, no changes in bowels, no fever. She is trying to eat smaller amounts and healthier foods.  Labs on 07/18/2021 CBC within normal limits, c-Met within normal limits, lipase 22, beta-hCG negative  Upper abdominal ultrasound showed multiple layering gallstones including 1 attentionally 2 cm stone towards the neck of the gallbladder, no gallbladder wall thickening and CBD of 2 mm  CT abdomen and pelvis with contrast shows multiple layering gravel type gallstones, no gallbladder wall thickening, no biliary dilation, liver enhancement normal, small fat-containing umbilical hernia   Review of Systems Pertinent positive and negative review of systems were noted in the above HPI section.  All other review of systems was otherwise  negative.   Outpatient Encounter Medications as of 07/29/2021  Medication Sig   ibuprofen (ADVIL,MOTRIN) 600 MG tablet Take 1 tablet (600 mg total) by mouth every 6 (six) hours as needed.   ondansetron (ZOFRAN-ODT) 4 MG disintegrating tablet Take 1 tablet (4 mg total) by mouth every 8 (eight) hours as needed for nausea or vomiting. (Patient not taking: Reported on 07/29/2021)   oxyCODONE-acetaminophen (PERCOCET) 5-325 MG per tablet 1 to 2 tablets every 6 hours as needed for pain. (Patient not taking: Reported on 07/29/2021)   No facility-administered encounter medications on file as of 07/29/2021.   Allergies  Allergen Reactions   Cortisone    There are no problems to display for this patient.  Social History   Socioeconomic History   Marital status: Married    Spouse name: Not on file   Number of children: 2   Years of education: Not on file   Highest education level: Not on file  Occupational History   Occupation: teacher  Tobacco Use   Smoking status: Never   Smokeless tobacco: Never  Vaping Use   Vaping Use: Never used  Substance and Sexual Activity   Alcohol use: No   Drug use: No   Sexual activity: Not on file  Other Topics Concern   Not on file  Social History Narrative   Not on file   Social Determinants of Health   Financial Resource Strain: Not on file  Food Insecurity: Not on file  Transportation Needs: Not on file  Physical Activity: Not  on file  Stress: Not on file  Social Connections: Not on file  Intimate Partner Violence: Not on file    Amber Koch's family history includes Breast cancer in her maternal aunt; Diabetes in her father, maternal grandfather, maternal grandmother, mother, paternal grandfather, and paternal grandmother; Lung cancer in her mother; Pancreatic cancer in her father.      Objective:    Vitals:   07/29/21 0829  BP: (!) 124/92  Pulse: 87    Physical ExamWell-developed well-nourished obese AA female in no acute distress.   Height, Weight 287, BMI 56.0  HEENT; nontraumatic normocephalic, EOMI, PE R LA, sclera anicteric. Oropharynx;nor examined Neck; supple, no JVD Cardiovascular; regular rate and rhythm with S1-S2, no murmur rub or gallop Pulmonary; Clear bilaterally Abdomen; soft,morbidly obese  minimal  tenderness RUQ  nondistended, no palpable mass or hepatosplenomegaly, bowel sounds are active Rectal;not done Skin; benign exam, no jaundice rash or appreciable lesions Extremities; no clubbing cyanosis or edema skin warm and dry Neuro/Psych; alert and oriented x4, grossly nonfocal mood and affect appropriate        Assessment & Plan:   #18 51 year old African-American female with onset of acute severe right back pain radiating into the right abdomen on 07/18/2021.  No associated nausea vomiting, no fever or chills, no changes in bowel habits  ER evaluation with normal labs Both ultrasound and CT confirm multiple gallstones, ultrasound shows 1 potential 2 cm gallstone located towards the neck of the gallbladder, no evidence of cholecystitis, and no biliary dilation.  Patient has continued to have pain over the past 2 weeks though not nearly as severe as it had been on 07/18/2021.  Symptoms are consistent with symptomatic cholelithiasis, biliary colic  #2 morbid obesity-BMI 24  #3 colon cancer screening-no prior colonoscopy  Plan; patient will be referred to [facility] surgery for consideration of laparoscopic cholecystectomy We discussed continuing a bland low-fat diet, with smaller meals Repeat CBC and c-Met today She should call or seek further medical care should she have recurrence of prolonged severe pain in the interim until she is seen by surgery.  I have asked her to follow-up with GI in several months, so that we can discuss screening colonoscopy Patient will be established with Dr. Harrel Carina PA-C 07/29/2021   Cc: No ref. provider found

## 2021-07-29 NOTE — Patient Instructions (Addendum)
If you are age 49 or younger, your body mass index should be between 19-25. Your Body mass index is 56.05 kg/m. If this is out of the aformentioned range listed, please consider follow up with your Primary Care Provider.  ________________________________________________________  The [facility] providers would like to encourage you to use [facility] to communicate with providers for non-urgent requests or questions.  Due to long hold times on the telephone, sending your provider a message by [facility] may be a faster and more efficient way to get a response.  Please allow 48 business hours for a response.  Please remember that this is for non-urgent requests.  _______________________________________________________  Your provider has requested that you go to the basement level for lab work before leaving today. Press "B" on the elevator. The lab is located at the first door on the left as you exit the elevator.  We will be referring you to [facility] Please allow 2-3 weeks for them to contact you to be scheduled They are located at: [address], [address] Phone: [phone]  Follow up after you have gone through a full evaluation with surgery to discuss having a colonoscopy.  Thank you for entrusting me with your care and choosing [facility].  Amy Esterwood, PA-C  Gallbladder Eating Plan High blood cholesterol, obesity, a sedentary lifestyle, an unhealthy diet, and diabetes are risk factors for developing gallstones. If you have a gallbladder condition, you may have trouble digesting fats and tolerating high fat intake. Eating a low-fat diet can help reduce your symptoms and may be helpful before and after having surgery to remove your gallbladder (cholecystectomy). Your health care provider may recommend that you work with a dietitian to help you reduce the amount of fat in your diet. What are tips for following this plan? General guidelines Limit your fat  intake to less than 30% of your total daily calories. If you eat around 1,800 calories each day, this means eating less than 60 grams (g) of fat per day. Fat is an important part of a healthy diet. Eating a low-fat diet can make it hard to maintain a healthy body weight. Ask your dietitian how much fat, calories, and other nutrients you need each day. Eat small, frequent meals throughout the day instead of three large meals. Drink at least 8-10 cups (1.9-2.4 L) of fluid a day. Drink enough fluid to keep your urine pale yellow. If you drink alcohol: Limit how much you have to: 0-1 drink a day for women who are not pregnant. 0-2 drinks a day for men. Know how much alcohol is in a drink. In the [place] one drink equals one 12 oz bottle of beer (355 mL), one 5 oz glass of wine (148 mL), or one 1 oz glass of hard liquor (44 mL). Reading food labels  Check nutrition facts on food labels for the amount of fat per serving. Choose foods with less than 3 grams of fat per serving. Shopping Choose nonfat and low-fat healthy foods. Look for the words "nonfat," "low-fat," or "fat-free." Avoid buying processed or prepackaged foods. Cooking Cook using low-fat methods, such as baking, broiling, grilling, or boiling. Cook with small amounts of healthy fats, such as olive oil, grapeseed oil, canola oil, avocado oil, or sunflower oil. What foods are recommended?  All fresh, frozen, or canned fruits and vegetables. Whole grains. Low-fat or nonfat (skim) milk and yogurt. Lean meat, skinless poultry, fish, eggs, and beans. Low-fat protein supplement powders or  drinks. Spices and herbs. The items listed above may not be a complete list of foods and beverages you can eat and drink. Contact a dietitian for more information. What foods are not recommended? High-fat foods. These include baked goods, fast food, fatty cuts of meat, ice cream, french toast, sweet rolls, pizza, cheese bread, foods covered with butter,  creamy sauces, or cheese. Fried foods. These include french fries, tempura, battered fish, breaded chicken, fried breads, and sweets. Foods that cause bloating and gas. The items listed above may not be a complete list of foods that you should avoid. Contact a dietitian for more information. Summary A low-fat diet can be helpful if you have a gallbladder condition, or before and after gallbladder surgery. Limit your fat intake to less than 30% of your total daily calories. This is about 60 g of fat if you eat 1,800 calories each day. Eat small, frequent meals throughout the day instead of three large meals. This information is not intended to replace advice given to you by your health care provider. Make sure you discuss any questions you have with your health care provider. Document Revised: 12/03/2020 Document Reviewed: 12/03/2020 Elsevier Patient Education  2023 [employer].

## 2021-08-26 ENCOUNTER — Ambulatory Visit: Payer: BC Managed Care – PPO | Admitting: Physician Assistant

## 2021-09-26 NOTE — Progress Notes (Signed)
Surgery orders requested via Epic inbox. °

## 2021-09-30 ENCOUNTER — Ambulatory Visit: Payer: Self-pay | Admitting: General Surgery

## 2021-09-30 NOTE — Progress Notes (Addendum)
COVID Vaccine Completed: yes  Date of COVID positive in last 90 days: no   PCP - Network engineer - n/a  Chest x-ray - n/a EKG - 07/18/21 Epic Stress Test - n/a ECHO - n/a Cardiac Cath - n/a Pacemaker/ICD device last checked: n/a Spinal Cord Stimulator: n/a  Bowel Prep - no  Sleep Study - n/a CPAP -   Fasting Blood Sugar - n/a Checks Blood Sugar _____ times a day  Blood Thinner Instructions: n/a Aspirin Instructions: Last Dose:  Activity level: Can go up a flight of stairs and perform activities of daily living without stopping and without symptoms of chest pain or shortness of breath.    Anesthesia review:   Patient denies shortness of breath, fever, cough and chest pain at PAT appointment  Patient verbalized understanding of instructions that were given to them at the PAT appointment. Patient was also instructed that they will need to review over the PAT instructions again at home before surgery.

## 2021-09-30 NOTE — Progress Notes (Signed)
Please place orders for PAT appointment scheduled 10/03/21.

## 2021-09-30 NOTE — Patient Instructions (Signed)
SURGICAL WAITING ROOM VISITATION Patients having surgery or a procedure may have no more than 2 support people in the waiting area - these visitors may rotate.   Children under the age of 49 must have an adult with them who is not the patient. If the patient needs to stay at the hospital during part of their recovery, the visitor guidelines for inpatient rooms apply. Pre-op nurse will coordinate an appropriate time for 1 support person to accompany patient in pre-op.  This support person may not rotate.    Please refer to the [facility] website for the visitor guidelines for Inpatients (after your surgery is over and you are in a regular room).     Your procedure is scheduled on: 10/13/21   Report to [facility] Main Entrance    Report to admitting at 12:00 PM   Call this number if you have problems the morning of surgery [phone]   Do not eat food :After Midnight.   After Midnight you may have the following liquids until 11:15 AM DAY OF SURGERY  Water Non-Citrus Juices (without pulp, NO RED) Carbonated Beverages Black Coffee (NO MILK/CREAM OR CREAMERS, sugar ok)  Clear Tea (NO MILK/CREAM OR CREAMERS, sugar ok) regular and decaf                             Plain Jell-O (NO RED)                                           Fruit ices (not with fruit pulp, NO RED)                                     Popsicles (NO RED)                                                               Sports drinks like Gatorade (NO RED)              Drink 2 Ensure/G2 drinks AT 10:00 PM the night before surgery.        The day of surgery:  Drink ONE (1) Pre-Surgery Clear Ensure or G2 at AM the morning of surgery. Drink in one sitting. Do not sip.  This drink was given to you during your hospital  pre-op appointment visit. Nothing else to drink after completing the  Pre-Surgery Clear Ensure or G2.          If you have questions, please contact your surgeon's office.   FOLLOW BOWEL PREP  AND ANY ADDITIONAL PRE OP INSTRUCTIONS YOU RECEIVED FROM YOUR SURGEON'S OFFICE!!!     Oral Hygiene is also important to reduce your risk of infection.                                    Remember - BRUSH YOUR TEETH THE MORNING OF SURGERY WITH YOUR REGULAR TOOTHPASTE   Do NOT smoke after Midnight   Take these medicines the morning of  surgery with A SIP OF WATER: Tylenol  DO NOT TAKE ANY ORAL DIABETIC MEDICATIONS DAY OF YOUR SURGERY  Bring CPAP mask and tubing day of surgery.                              You may not have any metal on your body including hair pins, jewelry, and body piercing             Do not wear make-up, lotions, powders, perfumes, or deodorant  Do not wear nail polish including gel and S&S, artificial/acrylic nails, or any other type of covering on natural nails including finger and toenails. If you have artificial nails, gel coating, etc. that needs to be removed by a nail salon please have this removed prior to surgery or surgery may need to be canceled/ delayed if the surgeon/ anesthesia feels like they are unable to be safely monitored.   Do not shave  48 hours prior to surgery.    Do not bring valuables to the hospital. [place].   Contacts, dentures or bridgework may not be worn into surgery.  DO NOT [place]. PHARMACY WILL DISPENSE MEDICATIONS LISTED ON YOUR MEDICATION LIST TO YOU DURING YOUR ADMISSION [place]!    Patients discharged on the day of surgery will not be allowed to drive home.  Someone NEEDS to stay with you for the first 24 hours after anesthesia.   Special Instructions: Bring a copy of your healthcare power of attorney and living will documents the day of surgery if you haven't scanned them before.              Please read over the following fact sheets you were given: IF [place] [phone] Schneiders   If you received a COVID test during your pre-op visit  it is requested that you wear a mask when out in public, stay away from anyone that may not be feeling well and notify your surgeon if you develop symptoms. If you test positive for Covid or have been in contact with anyone that has tested positive in the last 10 days please notify you surgeon.     [place] - Preparing for Surgery Before surgery, you can play an important role.  Because skin is not sterile, your skin needs to be as free of germs as possible.  You can reduce the number of germs on your skin by washing with CHG (chlorahexidine gluconate) soap before surgery.  CHG is an antiseptic cleaner which kills germs and bonds with the skin to continue killing germs even after washing. Please DO NOT use if you have an allergy to CHG or antibacterial soaps.  If your skin becomes reddened/irritated stop using the CHG and inform your nurse when you arrive at Short Stay. Do not shave (including legs and underarms) for at least 48 hours prior to the first CHG shower.  You may shave your face/neck.  Please follow these instructions carefully:  1.  Shower with CHG Soap the night before surgery and the  morning of surgery.  2.  If you choose to wash your hair, wash your hair first as usual with your normal  shampoo.  3.  After you shampoo, rinse your hair and body thoroughly to remove  the shampoo.                             4.  Use CHG as you would any other liquid soap.  You can apply chg directly to the skin and wash.  Gently with a scrungie or clean washcloth.  5.  Apply the CHG Soap to your body ONLY FROM THE NECK DOWN.   Do   not use on face/ open                           Wound or open sores. Avoid contact with eyes, ears mouth and   genitals (private parts).                       Wash face,  Genitals (private parts) with your normal soap.             6.  Wash thoroughly, paying special attention to the area where your    surgery   will be performed.  7.  Thoroughly rinse your body with warm water from the neck down.  8.  DO NOT shower/wash with your normal soap after using and rinsing off the CHG Soap.                9.  Pat yourself dry with a clean towel.            10.  Wear clean pajamas.            11.  Place clean sheets on your bed the night of your first shower and do not  sleep with pets. Day of Surgery : Do not apply any lotions/deodorants the morning of surgery.  Please wear clean clothes to the hospital/surgery center.  FAILURE TO FOLLOW THESE INSTRUCTIONS MAY RESULT IN THE CANCELLATION OF YOUR SURGERY  PATIENT SIGNATURE_________________________________  NURSE SIGNATURE__________________________________  ________________________________________________________________________

## 2021-10-03 ENCOUNTER — Encounter (HOSPITAL_COMMUNITY)
Admission: RE | Admit: 2021-10-03 | Discharge: 2021-10-03 | Disposition: A | Discharge status: Home / Self Care | Payer: BC Managed Care – PPO | Source: Ambulatory Visit | Attending: General Surgery | Admitting: General Surgery

## 2021-10-03 ENCOUNTER — Encounter (HOSPITAL_COMMUNITY): Payer: Self-pay

## 2021-10-03 VITALS — BP 143/89 | HR 74 | Temp 98.4°F | Resp 16 | Ht 60.0 in | Wt 274.0 lb

## 2021-10-03 DIAGNOSIS — Z01812 Encounter for preprocedural laboratory examination: Secondary | ICD-10-CM | POA: Insufficient documentation

## 2021-10-03 DIAGNOSIS — Z01818 Encounter for other preprocedural examination: Secondary | ICD-10-CM

## 2021-10-03 HISTORY — DX: Hypothyroidism, unspecified: E03.9

## 2021-10-03 LAB — CBC
HCT: 41.1 % (ref 36.0–46.0)
Hemoglobin: 13.1 g/dL (ref 12.0–15.0)
MCH: 27.7 pg (ref 26.0–34.0)
MCHC: 31.9 g/dL (ref 30.0–36.0)
MCV: 86.9 fL (ref 80.0–100.0)
Platelets: 318 10*3/uL (ref 150–400)
RBC: 4.73 MIL/uL (ref 3.87–5.11)
RDW: 13.3 % (ref 11.5–15.5)
WBC: 9 10*3/uL (ref 4.0–10.5)
nRBC: 0 % (ref 0.0–0.2)

## 2021-10-13 ENCOUNTER — Other Ambulatory Visit: Payer: Self-pay

## 2021-10-13 ENCOUNTER — Encounter (HOSPITAL_COMMUNITY)
Admission: RE | Disposition: A | Discharge status: Home / Self Care | Payer: Self-pay | Source: Ambulatory Visit | Attending: General Surgery

## 2021-10-13 ENCOUNTER — Ambulatory Visit (HOSPITAL_COMMUNITY): Payer: BC Managed Care – PPO | Admitting: Anesthesiology

## 2021-10-13 ENCOUNTER — Encounter (HOSPITAL_COMMUNITY): Payer: Self-pay | Admitting: General Surgery

## 2021-10-13 ENCOUNTER — Ambulatory Visit (HOSPITAL_COMMUNITY)
Admission: RE | Admit: 2021-10-13 | Discharge: 2021-10-13 | Disposition: A | Discharge status: Home / Self Care | Payer: BC Managed Care – PPO | Source: Ambulatory Visit | Attending: General Surgery | Admitting: General Surgery

## 2021-10-13 DIAGNOSIS — K760 Fatty (change of) liver, not elsewhere classified: Secondary | ICD-10-CM | POA: Insufficient documentation

## 2021-10-13 DIAGNOSIS — Z6841 Body Mass Index (BMI) 40.0 and over, adult: Secondary | ICD-10-CM | POA: Insufficient documentation

## 2021-10-13 DIAGNOSIS — K8010 Calculus of gallbladder with chronic cholecystitis without obstruction: Secondary | ICD-10-CM | POA: Diagnosis not present

## 2021-10-13 DIAGNOSIS — K8020 Calculus of gallbladder without cholecystitis without obstruction: Secondary | ICD-10-CM | POA: Diagnosis present

## 2021-10-13 DIAGNOSIS — K429 Umbilical hernia without obstruction or gangrene: Secondary | ICD-10-CM | POA: Insufficient documentation

## 2021-10-13 DIAGNOSIS — Z01818 Encounter for other preprocedural examination: Secondary | ICD-10-CM

## 2021-10-13 HISTORY — PX: CHOLECYSTECTOMY: SHX55

## 2021-10-13 SURGERY — LAPAROSCOPIC CHOLECYSTECTOMY WITH INTRAOPERATIVE CHOLANGIOGRAM
Anesthesia: General

## 2021-10-13 MED ORDER — OXYCODONE HCL 5 MG PO TABS: 5.0000 mg | ORAL_TABLET | Freq: Four times a day (QID) | ORAL | 0 refills | Status: AC | PRN

## 2021-10-13 MED ORDER — ONDANSETRON HCL 4 MG/2ML IJ SOLN
INTRAMUSCULAR | Status: DC | PRN
  Administered 2021-10-13: 4 mg via INTRAVENOUS

## 2021-10-13 MED ORDER — LACTATED RINGERS IV SOLN
INTRAVENOUS | Status: DC
Start: 2021-10-13 — End: 2021-10-13

## 2021-10-13 MED ORDER — PROPOFOL 10 MG/ML IV BOLUS
INTRAVENOUS | Status: DC | PRN
  Administered 2021-10-13: 200 mg via INTRAVENOUS

## 2021-10-13 MED ORDER — CHLORHEXIDINE GLUCONATE CLOTH 2 % EX PADS: 6.0000 | MEDICATED_PAD | Freq: Once | CUTANEOUS | Status: DC

## 2021-10-13 MED ORDER — ACETAMINOPHEN 500 MG PO TABS: 1000.0000 mg | ORAL_TABLET | Freq: Three times a day (TID) | ORAL | 0 refills | Status: AC

## 2021-10-13 MED ORDER — SODIUM CHLORIDE 0.9 % IV SOLN
2.0000 g | INTRAVENOUS | Status: AC
  Administered 2021-10-13: 2 g via INTRAVENOUS
  Filled 2021-10-13: qty 2

## 2021-10-13 MED ORDER — HYDROMORPHONE HCL 1 MG/ML IJ SOLN
0.2500 mg | INTRAMUSCULAR | Status: DC | PRN
  Administered 2021-10-13 (×5): 0.5 mg via INTRAVENOUS

## 2021-10-13 MED ORDER — ONDANSETRON 4 MG PO TBDP: 4.0000 mg | ORAL_TABLET | Freq: Three times a day (TID) | ORAL | 0 refills | Status: AC | PRN

## 2021-10-13 MED ORDER — LACTATED RINGERS IV SOLN: INTRAVENOUS | Status: DC | PRN

## 2021-10-13 MED ORDER — ACETAMINOPHEN 500 MG PO TABS
1000.0000 mg | ORAL_TABLET | ORAL | Status: AC
  Administered 2021-10-13: 1000 mg via ORAL
  Filled 2021-10-13: qty 2

## 2021-10-13 MED ORDER — HYDROMORPHONE HCL 1 MG/ML IJ SOLN
INTRAMUSCULAR | Status: AC
  Filled 2021-10-13: qty 1

## 2021-10-13 MED ORDER — ORAL CARE MOUTH RINSE: 15.0000 mL | Freq: Once | OROMUCOSAL | Status: AC

## 2021-10-13 MED ORDER — BUPIVACAINE LIPOSOME 1.3 % IJ SUSP
INTRAMUSCULAR | Status: AC
  Filled 2021-10-13: qty 20

## 2021-10-13 MED ORDER — OXYCODONE HCL 5 MG PO TABS: 5.0000 mg | ORAL_TABLET | Freq: Once | ORAL | Status: AC

## 2021-10-13 MED ORDER — CHLORHEXIDINE GLUCONATE 0.12 % MT SOLN
15.0000 mL | Freq: Once | OROMUCOSAL | Status: AC
  Administered 2021-10-13: 15 mL via OROMUCOSAL

## 2021-10-13 MED ORDER — ACETAMINOPHEN 500 MG PO TABS
ORAL_TABLET | ORAL | Status: AC
  Filled 2021-10-13: qty 2

## 2021-10-13 MED ORDER — SPY AGENT GREEN - (INDOCYANINE FOR INJECTION)
INTRAMUSCULAR | Status: DC | PRN
  Administered 2021-10-13: 2 mL via INTRAVENOUS

## 2021-10-13 MED ORDER — FENTANYL CITRATE (PF) 100 MCG/2ML IJ SOLN
INTRAMUSCULAR | Status: DC | PRN
  Administered 2021-10-13: 50 ug via INTRAVENOUS
  Administered 2021-10-13: 100 ug via INTRAVENOUS
  Administered 2021-10-13 (×2): 50 ug via INTRAVENOUS

## 2021-10-13 MED ORDER — ONDANSETRON HCL 4 MG/2ML IJ SOLN
INTRAMUSCULAR | Status: AC
  Filled 2021-10-13: qty 2

## 2021-10-13 MED ORDER — ACETAMINOPHEN 500 MG PO TABS
1000.0000 mg | ORAL_TABLET | Freq: Once | ORAL | Status: AC
  Administered 2021-10-13: 1000 mg via ORAL

## 2021-10-13 MED ORDER — LACTATED RINGERS IR SOLN
Status: DC | PRN
  Administered 2021-10-13: 1000 mL

## 2021-10-13 MED ORDER — SUGAMMADEX SODIUM 200 MG/2ML IV SOLN
INTRAVENOUS | Status: DC | PRN
  Administered 2021-10-13: 300 mg via INTRAVENOUS

## 2021-10-13 MED ORDER — BUPIVACAINE-EPINEPHRINE 0.25% -1:200000 IJ SOLN
INTRAMUSCULAR | Status: DC | PRN
  Administered 2021-10-13: 30 mL

## 2021-10-13 MED ORDER — FENTANYL CITRATE (PF) 250 MCG/5ML IJ SOLN
INTRAMUSCULAR | Status: AC
  Filled 2021-10-13: qty 5

## 2021-10-13 MED ORDER — LIDOCAINE 2% (20 MG/ML) 5 ML SYRINGE
INTRAMUSCULAR | Status: DC | PRN
  Administered 2021-10-13: 100 mg via INTRAVENOUS

## 2021-10-13 MED ORDER — SUGAMMADEX SODIUM 500 MG/5ML IV SOLN
INTRAVENOUS | Status: AC
  Filled 2021-10-13: qty 5

## 2021-10-13 MED ORDER — MIDAZOLAM HCL 5 MG/5ML IJ SOLN
INTRAMUSCULAR | Status: DC | PRN
  Administered 2021-10-13: 2 mg via INTRAVENOUS

## 2021-10-13 MED ORDER — PROPOFOL 10 MG/ML IV BOLUS
INTRAVENOUS | Status: AC
  Filled 2021-10-13: qty 20

## 2021-10-13 MED ORDER — OXYCODONE HCL 5 MG PO TABS
ORAL_TABLET | ORAL | Status: AC
  Administered 2021-10-13: 5 mg
  Filled 2021-10-13: qty 1

## 2021-10-13 MED ORDER — 0.9 % SODIUM CHLORIDE (POUR BTL) OPTIME
TOPICAL | Status: DC | PRN
  Administered 2021-10-13: 1000 mL

## 2021-10-13 MED ORDER — DEXAMETHASONE SODIUM PHOSPHATE 10 MG/ML IJ SOLN
INTRAMUSCULAR | Status: DC | PRN
  Administered 2021-10-13: 10 mg via INTRAVENOUS

## 2021-10-13 MED ORDER — MIDAZOLAM HCL 2 MG/2ML IJ SOLN
INTRAMUSCULAR | Status: AC
  Filled 2021-10-13: qty 2

## 2021-10-13 MED ORDER — LABETALOL HCL 5 MG/ML IV SOLN
INTRAVENOUS | Status: DC | PRN
  Administered 2021-10-13: 5 mg via INTRAVENOUS

## 2021-10-13 MED ORDER — BUPIVACAINE-EPINEPHRINE (PF) 0.25% -1:200000 IJ SOLN
INTRAMUSCULAR | Status: AC
  Filled 2021-10-13: qty 30

## 2021-10-13 MED ORDER — HYDROMORPHONE HCL 1 MG/ML IJ SOLN
INTRAMUSCULAR | Status: AC
  Filled 2021-10-13: qty 2

## 2021-10-13 MED ORDER — ROCURONIUM BROMIDE 10 MG/ML (PF) SYRINGE
PREFILLED_SYRINGE | INTRAVENOUS | Status: DC | PRN
  Administered 2021-10-13: 70 mg via INTRAVENOUS

## 2021-10-13 SURGICAL SUPPLY — 55 items
ADH SKN CLS APL DERMABOND .7 (GAUZE/BANDAGES/DRESSINGS)
APL PRP STRL LF DISP 70% ISPRP (MISCELLANEOUS) ×1
APL SRG 38 LTWT LNG FL B (MISCELLANEOUS)
APPLICATOR ARISTA FLEXITIP XL (MISCELLANEOUS) IMPLANT
APPLIER CLIP 5 13 M/L LIGAMAX5 (MISCELLANEOUS)
APPLIER CLIP ROT 10 11.4 M/L (STAPLE)
APR CLP MED LRG 11.4X10 (STAPLE)
APR CLP MED LRG 5 ANG JAW (MISCELLANEOUS)
BAG COUNTER SPONGE SURGICOUNT (BAG) IMPLANT
BAG SPEC RTRVL 10 TROC 200 (ENDOMECHANICALS) ×1
BAG SPNG CNTER NS LX DISP (BAG)
CABLE HIGH FREQUENCY MONO STRZ (ELECTRODE) ×1 IMPLANT
CHLORAPREP W/TINT 26 (MISCELLANEOUS) ×1 IMPLANT
CLIP APPLIE 5 13 M/L LIGAMAX5 (MISCELLANEOUS) IMPLANT
CLIP APPLIE ROT 10 11.4 M/L (STAPLE) IMPLANT
CLIP LIGATING HEMO O LOK GREEN (MISCELLANEOUS) IMPLANT
COVER MAYO STAND XLG (MISCELLANEOUS) IMPLANT
COVER SURGICAL LIGHT HANDLE (MISCELLANEOUS) ×1 IMPLANT
DERMABOND ADVANCED .7 DNX12 (GAUZE/BANDAGES/DRESSINGS) IMPLANT
DRAPE C-ARM 42X120 X-RAY (DRAPES) IMPLANT
DRSG TEGADERM 2-3/8X2-3/4 SM (GAUZE/BANDAGES/DRESSINGS) ×3 IMPLANT
DRSG TEGADERM 4X4.75 (GAUZE/BANDAGES/DRESSINGS) ×1 IMPLANT
ELECT REM PT RETURN 15FT ADLT (MISCELLANEOUS) ×1 IMPLANT
GAUZE SPONGE 2X2 8PLY STRL LF (GAUZE/BANDAGES/DRESSINGS) ×1 IMPLANT
GLOVE BIO SURGEON STRL SZ7.5 (GLOVE) ×1 IMPLANT
GLOVE INDICATOR 8.0 STRL GRN (GLOVE) ×1 IMPLANT
GOWN STRL REUS W/ TWL XL LVL3 (GOWN DISPOSABLE) ×1 IMPLANT
GOWN STRL REUS W/TWL XL LVL3 (GOWN DISPOSABLE) ×1
GRASPER SUT TROCAR 14GX15 (MISCELLANEOUS) IMPLANT
HEMOSTAT ARISTA ABSORB 3G PWDR (HEMOSTASIS) IMPLANT
HEMOSTAT SNOW SURGICEL 2X4 (HEMOSTASIS) IMPLANT
IRRIG SUCT STRYKERFLOW 2 WTIP (MISCELLANEOUS) ×1
IRRIGATION SUCT STRKRFLW 2 WTP (MISCELLANEOUS) ×1 IMPLANT
KIT BASIN OR (CUSTOM PROCEDURE TRAY) ×1 IMPLANT
KIT TURNOVER KIT A (KITS) IMPLANT
L-HOOK LAP DISP 36CM (ELECTROSURGICAL) ×1
LHOOK LAP DISP 36CM (ELECTROSURGICAL) IMPLANT
POUCH RETRIEVAL ECOSAC 10 (ENDOMECHANICALS) ×1 IMPLANT
POUCH RETRIEVAL ECOSAC 10MM (ENDOMECHANICALS) ×1
SCISSORS LAP 5X35 DISP (ENDOMECHANICALS) ×1 IMPLANT
SET CHOLANGIOGRAPH MIX (MISCELLANEOUS) IMPLANT
SET TUBE SMOKE EVAC HIGH FLOW (TUBING) ×1 IMPLANT
SLEEVE Z-THREAD 5X100MM (TROCAR) ×2 IMPLANT
SPIKE FLUID TRANSFER (MISCELLANEOUS) ×1 IMPLANT
STRIP CLOSURE SKIN 1/2X4 (GAUZE/BANDAGES/DRESSINGS) ×1 IMPLANT
SUT MNCRL AB 4-0 PS2 18 (SUTURE) ×1 IMPLANT
SUT VIC AB 0 UR5 27 (SUTURE) IMPLANT
SUT VICRYL 0 TIES 12 18 (SUTURE) IMPLANT
SUT VICRYL 0 UR6 27IN ABS (SUTURE) IMPLANT
TOWEL OR 17X26 10 PK STRL BLUE (TOWEL DISPOSABLE) ×1 IMPLANT
TOWEL OR NON WOVEN STRL DISP B (DISPOSABLE) ×1 IMPLANT
TRAY LAPAROSCOPIC (CUSTOM PROCEDURE TRAY) ×1 IMPLANT
TROCAR 11X100 Z THREAD (TROCAR) IMPLANT
TROCAR BALLN 12MMX100 BLUNT (TROCAR) ×1 IMPLANT
TROCAR Z-THREAD OPTICAL 5X100M (TROCAR) ×1 IMPLANT

## 2021-10-13 NOTE — Anesthesia Preprocedure Evaluation (Addendum)
Anesthesia Evaluation  Patient identified by MRN, date of birth, ID band Patient awake    Reviewed: Allergy & Precautions, H&P , NPO status , Patient's Chart, lab work & pertinent test results  Airway Mallampati: II  TM Distance: >3 FB Neck ROM: Full    Dental no notable dental hx. (+) Edentulous Upper, Edentulous Lower, Dental Advisory Given   Pulmonary neg pulmonary ROS,    Pulmonary exam normal breath sounds clear to auscultation       Cardiovascular negative cardio ROS   Rhythm:Regular Rate:Normal     Neuro/Psych negative neurological ROS  negative psych ROS   GI/Hepatic negative GI ROS, Neg liver ROS,   Endo/Other  Hypothyroidism Morbid obesity  Renal/GU negative Renal ROS  negative genitourinary   Musculoskeletal   Abdominal   Peds  Hematology negative hematology ROS (+)   Anesthesia Other Findings   Reproductive/Obstetrics negative OB ROS                            Anesthesia Physical Anesthesia Plan  ASA: 3  Anesthesia Plan: General   Post-op Pain Management: Tylenol PO (pre-op)*   Induction: Intravenous  PONV Risk Score and Plan: 4 or greater and Ondansetron, Dexamethasone and Midazolam  Airway Management Planned: Oral ETT  Additional Equipment:   Intra-op Plan:   Post-operative Plan: Extubation in OR  Informed Consent: I have reviewed the patients History and Physical, chart, labs and discussed the procedure including the risks, benefits and alternatives for the proposed anesthesia with the patient or authorized representative who has indicated his/her understanding and acceptance.     Dental advisory given  Plan Discussed with: CRNA  Anesthesia Plan Comments:         Anesthesia Quick Evaluation

## 2021-10-13 NOTE — Discharge Instructions (Signed)
[facility] LAPAROSCOPIC SURGERY: POST OP INSTRUCTIONS Always review your discharge instruction sheet given to you by the facility where your surgery was performed. IF YOU HAVE DISABILITY OR FAMILY LEAVE FORMS, YOU MUST BRING THEM TO THE OFFICE FOR PROCESSING.   DO NOT GIVE THEM TO YOUR DOCTOR.  PAIN CONTROL  First take acetaminophen (Tylenol) AND/or ibuprofen (Advil) to control your pain after surgery.  Follow directions on package.  Taking acetaminophen (Tylenol) and/or ibuprofen (Advil) regularly after surgery will help to control your pain and lower the amount of prescription pain medication you may need.  You should not take more than 3,000 mg (3 grams) of acetaminophen (Tylenol) in 24 hours.  You should not take ibuprofen (Advil), aleve, motrin, naprosyn or other NSAIDS if you have a history of stomach ulcers or chronic kidney disease.  A prescription for pain medication may be given to you upon discharge.  Take your pain medication as prescribed, if you still have uncontrolled pain after taking acetaminophen (Tylenol) or ibuprofen (Advil). Use ice packs to help control pain. If you need a refill on your pain medication, please contact your pharmacy.  They will contact our office to request authorization. Prescriptions will not be filled after 5pm or on week-ends.  HOME MEDICATIONS Take your usually prescribed medications unless otherwise directed.  DIET You should follow a light diet the first few days after arrival home.  Be sure to include lots of fluids daily. Avoid fatty, fried foods.   CONSTIPATION It is common to experience some constipation after surgery and if you are taking pain medication.  Increasing fluid intake and taking a stool softener (such as Colace) will usually help or prevent this problem from occurring.  A mild laxative (Milk of Magnesia or Miralax) should be taken according to package instructions if there are no bowel movements after 48  hours.  WOUND/INCISION CARE Most patients will experience some swelling and bruising in the area of the incisions.  Ice packs will help.  Swelling and bruising can take several days to resolve.  Unless discharge instructions indicate otherwise, follow guidelines below  STERI-STRIPS - you may remove your outer bandages 48 hours after surgery, and you may shower at that time.  You have steri-strips (small skin tapes) in place directly over the incision.  These strips should be left on the skin for 7-10 days.   DERMABOND/SKIN GLUE - you may shower in 24 hours.  The glue will flake off over the next 2-3 weeks. Any sutures or staples will be removed at the office during your follow-up visit.  ACTIVITIES You may resume regular (light) daily activities beginning the next day--such as daily self-care, walking, climbing stairs--gradually increasing activities as tolerated.  You may have sexual intercourse when it is comfortable.  Refrain from any heavy lifting or straining until approved by your doctor. You may drive when you are no longer taking prescription pain medication, you can comfortably wear a seatbelt, and you can safely maneuver your car and apply brakes.  FOLLOW-UP You should see your doctor in the office for a follow-up appointment approximately 2-3 weeks after your surgery.  You should have been given your post-op/follow-up appointment when your surgery was scheduled.  If you did not receive a post-op/follow-up appointment, make sure that you call for this appointment within a day or two after you arrive home to insure a convenient appointment time.  OTHER INSTRUCTIONS   WHEN TO CALL YOUR DOCTOR: Fever over 101.0 Inability to urinate Continued   bleeding from incision. Increased pain, redness, or drainage from the incision. Increasing abdominal pain  The clinic staff is available to answer your questions during regular business hours.  Please don't hesitate to call and ask to speak to  one of the nurses for clinical concerns.  If you have a medical emergency, go to the nearest emergency room or call 911.  A surgeon from [facility] is always on call at the hospital. [address], [address], [place], [place]  [address] ? [address] [address], [place], [place]   [address] [phone] ? [phone] ? FAX [phone] Web site: [email or web address]  

## 2021-10-13 NOTE — H&P (Signed)
CC: here for surgery  Requesting provider: Nicoletta Ba PA-C  HPI: Amber Koch is an 51 y.o. female who is here for laparoscopic cholecystectomy with ICG dye and possible cholangiogram.  I initially met her back in the office in August.  She denies any changes since I saw her in the office in clinic  Old hpi- Amber Koch is a 51 y.o. female who is seen today as an office consultation at the request of Dr. Trellis Paganini for evaluation of Abdominal Pain .   She went to the emergency room last month, with acute onset of right upper back pain that radiated underneath her right rib cage. It was very intense. No fever or chills. No nausea or vomiting with it. She states the pain was so intense that she really could not move. Since then she might of had 1 or 2 additional episodes but nothing near as severe as that episode. In the emergency room she underwent CT and ultrasound and was found to have cholelithiasis. She was referred to GI who evaluated her also felt that her presentation to the ER was due to symptomatic gallstones. She was then referred here. She denies any prior abdominal surgery. She denies any chest pain or chest pressure or shortness of breath. She just take Zofran as needed. Otherwise no chronic medications. She has been eating a bland diet since her visit to the ER and working on weight loss  Past Medical History:  Diagnosis Date   Hypothyroidism    Obesity    Thyroid disease     Past Surgical History:  Procedure Laterality Date   SHOULDER SURGERY  01/02/1998   left   SHOULDER SURGERY Left     Family History  Problem Relation Age of Onset   Lung cancer Mother    Diabetes Mother    Pancreatic cancer Father    Diabetes Father    Diabetes Maternal Grandmother    Diabetes Maternal Grandfather    Diabetes Paternal Grandmother    Diabetes Paternal Grandfather    Breast cancer Maternal Aunt    Stomach cancer Neg Hx    Esophageal cancer Neg Hx    Colon cancer Neg Hx      Social:  reports that she has never smoked. She has never used smokeless tobacco. She reports that she does not drink alcohol and does not use drugs.  Allergies:  Allergies  Allergen Reactions   Cortisone Hives    Shakes, and loses control of all bodily functions    Medications: I have reviewed the patient's current medications.   ROS - all of the below systems have been reviewed with the patient and positives are indicated with bold text General: chills, fever or night sweats Eyes: blurry vision or double vision ENT: epistaxis or sore throat Allergy/Immunology: itchy/watery eyes or nasal congestion Hematologic/Lymphatic: bleeding problems, blood clots or swollen lymph nodes Endocrine: temperature intolerance or unexpected weight changes Breast: new or changing breast lumps or nipple discharge Resp: cough, shortness of breath, or wheezing CV: chest pain or dyspnea on exertion GI: as per HPI GU: dysuria, trouble voiding, or hematuria MSK: joint pain or joint stiffness Neuro: TIA or stroke symptoms Derm: pruritus and skin lesion changes Psych: anxiety and depression  PE Blood pressure (!) 149/88, pulse 85, temperature 98.6 F (37 C), resp. rate 16, last menstrual period 07/16/2013, SpO2 100 %. Constitutional: NAD; conversant; no deformities Eyes: Moist conjunctiva; no lid lag; anicteric; PERRL Neck: Trachea midline; no thyromegaly Lungs: Normal respiratory effort; no tactile  fremitus CV: RRR; no palpable thrills; no pitting edema GI: Abd soft, nt, nd, fat containing umbilical hernia; no palpable hepatosplenomegaly MSK: Normal gait; no clubbing/cyanosis Psychiatric: Appropriate affect; alert and oriented x3 Lymphatic: No palpable cervical or axillary lymphadenopathy Skin:no rash/lesions  No results found for this or any previous visit (from the past 10 hour(s)).  No results found.  Imaging: reviewed  A/P: Amber Koch is an 51 y.o. female with  Symptomatic  cholelithiasis  Umbilical hernia without obstruction and without gangrene  Severe obesity (CMS-HCC)   to OR for laparoscopic cholecystectomy with ICG dye and possible cholangiogram Enhanced recovery protocol IV antibiotic SCDs  We will work around her umbilical hernia.  Her defect is about 2 cm in the vertical dimension.  This would generally require mesh reinforcement and since this is a clean contaminated procedure I would be reluctant to place mesh.  Leighton Ruff. Redmond Pulling, MD, FACS General, Bariatric, & Minimally Invasive Surgery [facility]

## 2021-10-13 NOTE — Anesthesia Procedure Notes (Signed)
Procedure Name: Intubation Date/Time: 10/13/2021 2:47 PM  Performed by: Gean Maidens, CRNAPre-anesthesia Checklist: Patient identified, Emergency Drugs available, Suction available, Patient being monitored and Timeout performed Patient Re-evaluated:Patient Re-evaluated prior to induction Oxygen Delivery Method: Circle system utilized Preoxygenation: Pre-oxygenation with 100% oxygen Ventilation: Mask ventilation without difficulty Laryngoscope Size: Glidescope and 4 Grade View: Grade II Tube type: Oral Tube size: 7.0 mm Number of attempts: 2 Airway Equipment and Method: Video-laryngoscopy and Stylet Placement Confirmation: ETT inserted through vocal cords under direct vision, positive ETCO2 and breath sounds checked- equal and bilateral Secured at: 22 cm Tube secured with: Tape Dental Injury: Teeth and Oropharynx as per pre-operative assessment  Difficulty Due To: Difficulty was unanticipated Future Recommendations: Recommend- induction with short-acting agent, and alternative techniques readily available Comments: Difficulty d/t large amount of redundant soft tissues which increased the difficulty of identifying airway structures.

## 2021-10-13 NOTE — Interval H&P Note (Signed)
History and Physical Interval Note:  10/13/2021 1:55 PM  Amber Koch  has presented today for surgery, with the diagnosis of SYMPTOMATIC CHOLELITHIASIS.  The various methods of treatment have been discussed with the patient and family. After consideration of risks, benefits and other options for treatment, the patient has consented to  Procedure(s): LAPAROSCOPIC CHOLECYSTECTOMY WITH ICG DYE POSSIBLE INTRAOPERATIVE CHOLANGIOGRAM (N/A) INDOCYANINE GREEN FLUORESCENCE IMAGING (ICG) (N/A) as a surgical intervention.  The patient's history has been reviewed, patient examined, no change in status, stable for surgery.  I have reviewed the patient's chart and labs.  Questions were answered to the patient's satisfaction.     Amber Koch

## 2021-10-13 NOTE — Transfer of Care (Signed)
Immediate Anesthesia Transfer of Care Note  Patient: Amber Koch  Procedure(s) Performed: LAPAROSCOPIC CHOLECYSTECTOMY WITH ICG DYE POSSIBLE INTRAOPERATIVE CHOLANGIOGRAM INDOCYANINE GREEN FLUORESCENCE IMAGING (ICG)  Patient Location: PACU  Anesthesia Type:General  Level of Consciousness: sedated, patient cooperative and responds to stimulation  Airway & Oxygen Therapy: Patient Spontanous Breathing and Patient connected to face mask oxygen  Post-op Assessment: Report given to RN and Post -op Vital signs reviewed and stable  Post vital signs: Reviewed and stable  Last Vitals:  Vitals Value Taken Time  BP 179/117 10/13/21 1551  Temp    Pulse 92 10/13/21 1553  Resp 19 10/13/21 1553  SpO2 100 % 10/13/21 1553  Vitals shown include unvalidated device data.  Last Pain:  Vitals:   10/13/21 1002  PainSc: 2          Complications:  Encounter Notable Events  Notable Event Outcome Phase Comment  Difficult to intubate - unexpected  Intraprocedure Filed from anesthesia note documentation.

## 2021-10-13 NOTE — Anesthesia Postprocedure Evaluation (Signed)
Anesthesia Post Note  Patient: Amber Koch  Procedure(s) Performed: LAPAROSCOPIC CHOLECYSTECTOMY WITH ICG DYE POSSIBLE INTRAOPERATIVE CHOLANGIOGRAM INDOCYANINE GREEN FLUORESCENCE IMAGING (ICG)     Patient location during evaluation: PACU Anesthesia Type: General Level of consciousness: awake and alert Pain management: pain level controlled Vital Signs Assessment: post-procedure vital signs reviewed and stable Respiratory status: spontaneous breathing, nonlabored ventilation and respiratory function stable Cardiovascular status: blood pressure returned to baseline and stable Postop Assessment: no apparent nausea or vomiting Anesthetic complications: yes   Encounter Notable Events  Notable Event Outcome Phase Comment  Difficult to intubate - unexpected  Intraprocedure Filed from anesthesia note documentation.    Last Vitals:  Vitals:   10/13/21 1715 10/13/21 1756  BP: (!) 140/79 137/81  Pulse: 93 71  Resp: 10 14  Temp:  36.7 C  SpO2: 97% 97%    Last Pain:  Vitals:   10/13/21 1756  PainSc: 3                  Buckley Bradly,W. EDMOND

## 2021-10-13 NOTE — Op Note (Signed)
Christyl Osentoski [id number] [birth year] 10/13/2021  Laparoscopic Cholecystectomy with near infrared fluorescent cholangiography procedure Note  Indications: This patient presents with symptomatic gallbladder disease and will undergo laparoscopic cholecystectomy.  Pre-operative Diagnosis: Symptomatic cholelithiasis, umbilical hernia  Post-operative Diagnosis: Same, fatty liver, fat containing umbilical hernia  Surgeon: Gaynelle Adu MD FACS  Assistants: none  Anesthesia: General endotracheal anesthesia   Procedure Details  The patient was seen again in the Holding Room. The risks, benefits, complications, treatment options, and expected outcomes were discussed with the patient. The possibilities of reaction to medication, pulmonary aspiration, perforation of viscus, bleeding, recurrent infection, finding a normal gallbladder, the need for additional procedures, failure to diagnose a condition, the possible need to convert to an open procedure, and creating a complication requiring transfusion or operation were discussed with the patient. The likelihood of improving the patient's symptoms with return to their baseline status is good.  The patient and/or family concurred with the proposed plan, giving informed consent. The site of surgery properly noted. The patient was taken to Operating Room, identified as Marcine Matar and the procedure verified as Laparoscopic Cholecystectomy with ICG dye.  A Time Out was held and the above information confirmed. Antibiotic prophylaxis was administered.   Prior to the induction of general anesthesia, antibiotic prophylaxis was administered. General endotracheal anesthesia was then administered and tolerated well. After the induction, the abdomen was prepped with Chloraprep and draped in the sterile fashion. The patient was positioned in the supine position.  Patient has a known fat-containing umbilical hernia.  I decided to leave it alone since this was a clean  contaminated procedure and the defect was large enough where it would require mesh in order to help minimize recurrence.  Therefore I gained access to the abdomen in the left upper quadrant using the Optiview technique.  A small incision was made in the left upper quadrant.  Then using a 0 degree 5 mm laparoscope through a 5 mm trocar I advanced it through all layers of the abdominal wall and carefully entered the abdominal cavity.  Pneumoperitoneum was smoothly established up to a patient pressure of 15 mmHg.  There was no evidence of injury to surrounding viscera.  Patient had a fat-containing umbilical hernia.  The patient was placed in reverse Trendelenburg and rotated to the left.  A 5 mm trocar was placed in the supraumbilical position.  I exchanged the optical 5 mm trocar for a 12 mm trocar in the left upper quadrant.   Two 5-mm ports were placed in the right upper quadrant. All skin incisions were infiltrated with a local anesthetic agent before making the incision and placing the trocars.    The gallbladder was identified, the fundus grasped and retracted cephalad. Adhesions were lysed bluntly and with the electrocautery where indicated, taking care not to injure any adjacent organs or viscus. The infundibulum was grasped and retracted laterally, exposing the peritoneum overlying the triangle of Calot. This was then divided and exposed in a blunt fashion. A critical view of the cystic duct and cystic artery was obtained.  The cystic duct was clearly identified and bluntly dissected circumferentially.  Utilizing the [employer] camera system near infrared fluorescent activity was visualized in the liver, cystic duct, common hepatic duct and common bile duct and small bowel.  This served as a secondary confirmation of our anatomy.  The cystic duct was then ligated with hemolock clips and divided. The cystic artery which had been identified & dissected free was ligated with hemolock clips and divided  as  well.   The gallbladder was dissected from the liver bed in retrograde fashion with the electrocautery.  There was some spillage of bile from the gallbladder but no gallstones spillage.  The gallbladder was removed and placed in an Ecco sac. The liver bed was irrigated and inspected. Hemostasis was achieved with the electrocautery. Copious irrigation was utilized and was repeatedly aspirated until clear.   The gallbladder and Ecco sac were then removed through the left upper quadrant port site.  The fascial defect in the left upper quadrant was closed with 2 interrupted 0 Vicryl's using a PMI suture passer with laparoscopic guidance.  We again inspected the right upper quadrant for hemostasis.  The left upper quadrant trocar closure was inspected and there was no air leak and nothing trapped within the closure. Pneumoperitoneum was released as we removed the trocars.  4-0 Monocryl was used to close the skin.   steri-strips, and clean dressings were applied. The patient was then extubated and brought to the recovery room in stable condition. Instrument, sponge, and needle counts were correct at closure and at the conclusion of the case.   Findings: +critical view Fluorescent activity seen in common hepatic, common bile ducts, cystic ducts;   Estimated Blood Loss: Minimal         Drains: none         Specimens: Gallbladder           Complications: None; patient tolerated the procedure well.         Disposition: PACU - hemodynamically stable.         Condition: stable  Leighton Ruff. Redmond Pulling, MD, FACS General, Bariatric, & Minimally Invasive Surgery [facility],  [place]

## 2021-10-14 ENCOUNTER — Encounter (HOSPITAL_COMMUNITY): Payer: Self-pay | Admitting: General Surgery

## 2021-10-17 LAB — SURGICAL PATHOLOGY

## 2021-12-08 ENCOUNTER — Other Ambulatory Visit (HOSPITAL_COMMUNITY): Payer: Self-pay | Admitting: Home Modifications

## 2021-12-08 DIAGNOSIS — E0590 Thyrotoxicosis, unspecified without thyrotoxic crisis or storm: Secondary | ICD-10-CM

## 2021-12-28 ENCOUNTER — Other Ambulatory Visit: Payer: Self-pay | Admitting: Nurse Practitioner

## 2021-12-28 DIAGNOSIS — E0590 Thyrotoxicosis, unspecified without thyrotoxic crisis or storm: Secondary | ICD-10-CM

## 2021-12-29 ENCOUNTER — Ambulatory Visit (HOSPITAL_COMMUNITY): Payer: BC Managed Care – PPO

## 2021-12-29 ENCOUNTER — Encounter (HOSPITAL_COMMUNITY)
Admission: RE | Admit: 2021-12-29 | Discharge: 2021-12-29 | Disposition: A | Discharge status: Home / Self Care | Payer: BC Managed Care – PPO | Source: Ambulatory Visit | Attending: Home Modifications | Admitting: Home Modifications

## 2021-12-29 DIAGNOSIS — E0590 Thyrotoxicosis, unspecified without thyrotoxic crisis or storm: Secondary | ICD-10-CM | POA: Insufficient documentation

## 2021-12-30 ENCOUNTER — Encounter (HOSPITAL_COMMUNITY): Payer: BC Managed Care – PPO

## 2022-01-03 ENCOUNTER — Encounter (HOSPITAL_COMMUNITY)
Admission: RE | Admit: 2022-01-03 | Discharge: 2022-01-03 | Disposition: A | Discharge status: Home / Self Care | Payer: BC Managed Care – PPO | Source: Ambulatory Visit | Attending: Home Modifications | Admitting: Home Modifications

## 2022-01-03 DIAGNOSIS — R221 Localized swelling, mass and lump, neck: Secondary | ICD-10-CM | POA: Insufficient documentation

## 2022-01-03 DIAGNOSIS — E049 Nontoxic goiter, unspecified: Secondary | ICD-10-CM | POA: Diagnosis not present

## 2022-01-03 DIAGNOSIS — R160 Hepatomegaly, not elsewhere classified: Secondary | ICD-10-CM | POA: Insufficient documentation

## 2022-01-03 DIAGNOSIS — E0590 Thyrotoxicosis, unspecified without thyrotoxic crisis or storm: Secondary | ICD-10-CM | POA: Insufficient documentation

## 2022-01-03 MED ORDER — SODIUM IODIDE I 131 CAPSULE
15.3000 | Freq: Once | INTRAVENOUS | Status: AC | PRN
  Administered 2022-01-03: 15.3 via ORAL

## 2022-01-04 ENCOUNTER — Encounter (HOSPITAL_COMMUNITY)
Admission: RE | Admit: 2022-01-04 | Discharge: 2022-01-04 | Disposition: A | Discharge status: Home / Self Care | Payer: BC Managed Care – PPO | Source: Ambulatory Visit | Attending: Home Modifications | Admitting: Home Modifications

## 2022-01-20 ENCOUNTER — Ambulatory Visit
Admission: RE | Admit: 2022-01-20 | Discharge: 2022-01-20 | Disposition: A | Discharge status: Home / Self Care | Payer: BC Managed Care – PPO | Source: Ambulatory Visit | Attending: Nurse Practitioner | Admitting: Nurse Practitioner

## 2022-01-20 DIAGNOSIS — E0590 Thyrotoxicosis, unspecified without thyrotoxic crisis or storm: Secondary | ICD-10-CM

## 2022-01-26 ENCOUNTER — Other Ambulatory Visit: Payer: Self-pay | Admitting: Nurse Practitioner

## 2022-01-26 DIAGNOSIS — E041 Nontoxic single thyroid nodule: Secondary | ICD-10-CM

## 2022-02-16 ENCOUNTER — Ambulatory Visit
Admission: RE | Admit: 2022-02-16 | Discharge: 2022-02-16 | Disposition: A | Discharge status: Home / Self Care | Payer: BC Managed Care – PPO | Source: Ambulatory Visit | Attending: Nurse Practitioner | Admitting: Nurse Practitioner

## 2022-02-16 ENCOUNTER — Other Ambulatory Visit (HOSPITAL_COMMUNITY)
Admission: RE | Admit: 2022-02-16 | Discharge: 2022-02-16 | Disposition: A | Discharge status: Home / Self Care | Payer: BC Managed Care – PPO | Source: Ambulatory Visit | Attending: Nurse Practitioner | Admitting: Nurse Practitioner

## 2022-02-16 DIAGNOSIS — E041 Nontoxic single thyroid nodule: Secondary | ICD-10-CM

## 2022-02-20 LAB — CYTOLOGY - NON PAP

## 2022-02-23 ENCOUNTER — Other Ambulatory Visit (HOSPITAL_COMMUNITY): Payer: Self-pay | Admitting: Nurse Practitioner

## 2022-02-23 DIAGNOSIS — E0590 Thyrotoxicosis, unspecified without thyrotoxic crisis or storm: Secondary | ICD-10-CM

## 2022-02-23 NOTE — Written Directive (Addendum)
MOLECULAR IMAGING AND THERAPEUTICS WRITTEN DIRECTIVE   PATIENT NAME: Amber Koch  PT DOB:   Feb 11, 1970                                              MRN: [id number]  ---------------------------------------------------------------------------------------------------------------------   I-131 WHOLE THYROID THERAPY (NON-CANCER)    RADIOPHARMACEUTICAL:   Iodine-131 Capsule    PRESCRIBED DOSE FOR ADMINISTRATION: 30 mCi   ROUTE OF ADMINISTRATION: PO   DIAGNOSIS:  hyperthyroidism   REFERRING PHYSICIAN:Jessica  Easley, FNP   TSH:   0.01 (12/08/21)    PRIOR I-131 THERAPY (Date and Dose):na   PRIOR RADIOLOGY EXAMS (Results and Date): US THYROID  Result Date: 01/21/2022 CLINICAL DATA:  Hyperthyroid.  SUBCLINICAL HYPERTHYROIDISM EXAM: THYROID ULTRASOUND TECHNIQUE: Ultrasound examination of the thyroid gland and adjacent soft tissues was performed. COMPARISON:  Thyroid uptake and scan, 01/04/2022. FINDINGS: Suboptimal evaluation, with poor acoustic penetration secondary to patient habitus. Parenchymal Echotexture: Markedly heterogenous Isthmus: 1.0 cm Right lobe: 8.1 x 4.4 x 4.8 cm Left lobe: 10.0 x 4.3 x 5.5 cm _________________________________________________________ Estimated total number of nodules >/= 1 cm: 5 Number of spongiform nodules >/=  2 cm not described below (TR1): 0 Number of mixed cystic and solid nodules >/= 1.5 cm not described below (TR2): 0 _________________________________________________________ Nodule # 1: Location: Isthmus Maximum size: 1.3 cm; Other 2 dimensions: 1.2 x 1.1 cm Composition: solid/almost completely solid (2) Echogenicity: isoechoic (1) Shape: not taller-than-wide (0) Margins: ill-defined (0) Echogenic foci: none (0) ACR TI-RADS total points: 3. ACR TI-RADS risk category: TR3 (3 points). ACR TI-RADS recommendations: Given size (<1.4 cm) and appearance, this nodule does NOT meet TI-RADS criteria for biopsy or dedicated follow-up.  _________________________________________________________ Nodule # 2: Location: RIGHT; Superior Maximum size: 3.9 cm; Other 2 dimensions: 3.9 x 3.2 cm Composition: solid/almost completely solid (2) Echogenicity: isoechoic (1) Shape: not taller-than-wide (0) Margins: ill-defined (0) Echogenic foci: none (0) ACR TI-RADS total points: 3. ACR TI-RADS risk category: TR3 (3 points). ACR TI-RADS recommendations: **Given size (>/= 2.5 cm) and appearance, fine needle aspiration of this mildly suspicious nodule should be considered based on TI-RADS criteria. _________________________________________________________ Nodule # 3: Location: RIGHT; Mid Maximum size: 4.4 cm; Other 2 dimensions: 4.1 x 3.7 cm Composition: solid/almost completely solid (2) Echogenicity: isoechoic (1) Shape: not taller-than-wide (0) Margins: ill-defined (0) Echogenic foci: none (0) ACR TI-RADS total points: 3. ACR TI-RADS risk category: TR3 (3 points). ACR TI-RADS recommendations: **Given size (>/= 2.5 cm) and appearance, fine needle aspiration of this mildly suspicious nodule should be considered based on TI-RADS criteria. _________________________________________________________ Nodule # 4: Location: RIGHT; Inferior Maximum size: 3.5 cm; Other 2 dimensions: 2.8 x 2.7 cm Composition: solid/almost completely solid (2) Echogenicity: isoechoic (1) Shape: not taller-than-wide (0) Margins: ill-defined (0) Echogenic foci: none (0) ACR TI-RADS total points: 3. ACR TI-RADS risk category: TR3 (3 points). ACR TI-RADS recommendations: **Given size (>/= 2.5 cm) and appearance, fine needle aspiration of this mildly suspicious nodule should be considered based on TI-RADS criteria. _________________________________________________________ Nodule # 5: Location: LEFT; Mid Maximum size: 6.1 cm; Other 2 dimensions: 5.1 x 4.2 cm Composition: solid/almost completely solid (2) Echogenicity: hyperechoic (1) Shape: not taller-than-wide (0) Margins: ill-defined (0) Echogenic  foci: none (0) ACR TI-RADS total points: 3. ACR TI-RADS risk category: TR3 (3 points). ACR TI-RADS recommendations: **Given size (>/= 2.5 cm) and appearance, fine needle aspiration of this mildly suspicious  nodule should be considered based on TI-RADS criteria. _________________________________________________________ No cervical adenopathy or abnormal fluid collection within the imaged neck. IMPRESSION: 1. 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 2. 3.9 cm RIGHT superior TR-3 thyroid nodule likely correlates with hyperfunctioning nodule on NM uptake and scan. FNA biopsy of this mildly suspicious nodule should be considered based on TI-RADS criteria. 3. Three (3) additional nodules listed above also meet threshold size for biopsy per current criteria, but by protocol only the 2 most suspicious nodules are biopsied at time. Consider additional biopsy of dominant 6.1 cm LEFT mid TR-3 thyroid nodule. The above is in keeping with the ACR TI-RADS recommendations - Royalty Dom[employer]ala Am Coll Radiol 2017;14:[id number]. Electronically Signed   By: Michaelle Birks M.D.   On: 01/21/2022 10:12   NM THYROID MULT UPTAKE W/IMAGING  Result Date: 01/04/2022 CLINICAL DATA:  Hyperthyroidism.  Bilateral neck swelling. EXAM: THYROID SCAN AND UPTAKE - 4 AND 24 HOURS TECHNIQUE: Following the per oral administration of I-131 sodium iodide, the patient returned at 4 and 24 hours and uptake measurements were acquired with the uptake probe centered on the neck. Thyroid imaging was performed following the intravenous administration of Tc-48mPertechnetate. RADIOPHARMACEUTICALS:  4.2 mCi Technetium-911mertechnetate IV and 15.3 microCuries I-131 sodium iodide orally COMPARISON:  None Available. FINDINGS: Thyroid gland is markedly enlarged. There is faint uptake in the enlarged thyroid gland. Within the superior aspect of the RIGHT lobe of the thyroid gland there is intensely radiotracer avid nodule measuring approximately 3 cm. 4  hour I-131 uptake = 15.1% (normal 5-20%) 24 hour I-131 uptake = 33.6% (normal 10-30%) IMPRESSION: 1. Markedly enlarged thyroid gland. 2. Hyperfunctioning nodule in the RIGHT lobe of thyroid gland suppressing the enlarged LEFT and RIGHT hepatic lobe. 3. Recommend thyroid ultrasound for confirmation. These results will be called to the ordering clinician or representative by the Radiologist Assistant, and communication documented in the PACS or Cl[employer] CorporationElectronically Signed   By: StSuzy Bouchard.D.   On: 01/04/2022 16:11      ADDITIONAL PHYSICIAN COMMENTS/NOTES Autonomous hyperfunctioning nodule in Right lobe thyroid gland. Depressed TSH.   AUTHORIZED USER SIGNATURE & TIME STAMP: JoRennis GoldenMD   02/24/22    10:18 AM

## 2022-03-06 ENCOUNTER — Other Ambulatory Visit: Payer: Self-pay | Admitting: Interventional Radiology

## 2022-03-06 DIAGNOSIS — E041 Nontoxic single thyroid nodule: Secondary | ICD-10-CM

## 2022-03-07 ENCOUNTER — Ambulatory Visit
Admission: RE | Admit: 2022-03-07 | Discharge: 2022-03-07 | Disposition: A | Discharge status: Home / Self Care | Payer: BC Managed Care – PPO | Source: Ambulatory Visit | Attending: Interventional Radiology | Admitting: Interventional Radiology

## 2022-03-07 DIAGNOSIS — Z6841 Body Mass Index (BMI) 40.0 and over, adult: Secondary | ICD-10-CM | POA: Insufficient documentation

## 2022-03-07 DIAGNOSIS — E0590 Thyrotoxicosis, unspecified without thyrotoxic crisis or storm: Secondary | ICD-10-CM | POA: Insufficient documentation

## 2022-03-07 DIAGNOSIS — E041 Nontoxic single thyroid nodule: Secondary | ICD-10-CM

## 2022-03-07 DIAGNOSIS — I10 Essential (primary) hypertension: Secondary | ICD-10-CM | POA: Insufficient documentation

## 2022-03-07 HISTORY — PX: IR RADIOLOGIST EVAL & MGMT: IMG5224

## 2022-03-24 ENCOUNTER — Ambulatory Visit (HOSPITAL_COMMUNITY)
Admission: RE | Admit: 2022-03-24 | Discharge: 2022-03-24 | Disposition: A | Discharge status: Home / Self Care | Payer: BC Managed Care – PPO | Source: Ambulatory Visit | Attending: Nurse Practitioner | Admitting: Nurse Practitioner

## 2022-03-24 DIAGNOSIS — E0590 Thyrotoxicosis, unspecified without thyrotoxic crisis or storm: Secondary | ICD-10-CM | POA: Diagnosis present
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
## 2022-05-24 ENCOUNTER — Other Ambulatory Visit (HOSPITAL_BASED_OUTPATIENT_CLINIC_OR_DEPARTMENT_OTHER): Payer: Self-pay | Admitting: Surgery

## 2022-05-24 DIAGNOSIS — E0520 Thyrotoxicosis with toxic multinodular goiter without thyrotoxic crisis or storm: Secondary | ICD-10-CM

## 2022-05-26 ENCOUNTER — Ambulatory Visit (HOSPITAL_COMMUNITY): Payer: BC Managed Care – PPO

## 2022-06-22 ENCOUNTER — Ambulatory Visit (HOSPITAL_COMMUNITY)
Admission: RE | Admit: 2022-06-22 | Discharge: 2022-06-22 | Disposition: A | Discharge status: Home / Self Care | Payer: BC Managed Care – PPO | Source: Ambulatory Visit | Attending: Surgery | Admitting: Surgery

## 2022-06-22 DIAGNOSIS — E0520 Thyrotoxicosis with toxic multinodular goiter without thyrotoxic crisis or storm: Secondary | ICD-10-CM | POA: Diagnosis present

## 2022-06-22 MED ORDER — IOHEXOL 350 MG/ML SOLN
75.0000 mL | Freq: Once | INTRAVENOUS | Status: AC | PRN
  Administered 2022-06-22: 75 mL via INTRAVENOUS

## 2022-07-10 NOTE — Progress Notes (Signed)
CT scan images reviewed and final written report reviewed.  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  This will require evaluation at a tertiary center.  I discussed referral to [facility] to the endocrine surgery division with the patient.  Tresa Endo - please start referral to Dr. Jeanella Anton or Dr. Esmeralda Links at [facility].  Darnell Level, MD [facility] Surgery A [facility] practice Office: [phone]
